# Patient Record
Sex: Male | Born: 1935 | Race: White | Hispanic: No | Marital: Married | Smoking: Former smoker
Health system: Southern US, Community
[De-identification: ages and names within clinical notes are randomized; demographics above are authoritative.]

## PROBLEM LIST (undated history)

## (undated) DIAGNOSIS — K219 Gastro-esophageal reflux disease without esophagitis: Secondary | ICD-10-CM

## (undated) DIAGNOSIS — N400 Enlarged prostate without lower urinary tract symptoms: Secondary | ICD-10-CM

## (undated) DIAGNOSIS — I2510 Atherosclerotic heart disease of native coronary artery without angina pectoris: Secondary | ICD-10-CM

## (undated) DIAGNOSIS — D696 Thrombocytopenia, unspecified: Secondary | ICD-10-CM

## (undated) DIAGNOSIS — F0390 Unspecified dementia without behavioral disturbance: Secondary | ICD-10-CM

## (undated) DIAGNOSIS — F0280 Dementia in other diseases classified elsewhere without behavioral disturbance: Secondary | ICD-10-CM

## (undated) DIAGNOSIS — G309 Alzheimer's disease, unspecified: Secondary | ICD-10-CM

## (undated) DIAGNOSIS — E785 Hyperlipidemia, unspecified: Secondary | ICD-10-CM

## (undated) DIAGNOSIS — Z8679 Personal history of other diseases of the circulatory system: Secondary | ICD-10-CM

---

## 1998-08-20 ENCOUNTER — Other Ambulatory Visit: Admission: RE | Admit: 1998-08-20 | Discharge: 1998-08-20 | Payer: Self-pay | Admitting: Urology

## 1998-09-30 ENCOUNTER — Observation Stay (HOSPITAL_COMMUNITY): Admission: AD | Admit: 1998-09-30 | Discharge: 1998-10-01 | Payer: Self-pay | Admitting: Cardiology

## 1999-01-20 ENCOUNTER — Encounter: Payer: Self-pay | Admitting: Cardiothoracic Surgery

## 1999-01-20 ENCOUNTER — Encounter (INDEPENDENT_AMBULATORY_CARE_PROVIDER_SITE_OTHER): Payer: Self-pay | Admitting: *Deleted

## 1999-01-20 ENCOUNTER — Inpatient Hospital Stay (HOSPITAL_COMMUNITY): Admission: RE | Admit: 1999-01-20 | Discharge: 1999-01-26 | Payer: Self-pay | Admitting: Cardiology

## 1999-01-20 HISTORY — PX: CORONARY ARTERY BYPASS GRAFT: SHX141

## 1999-01-21 ENCOUNTER — Encounter: Payer: Self-pay | Admitting: Cardiothoracic Surgery

## 1999-01-22 ENCOUNTER — Encounter: Payer: Self-pay | Admitting: Cardiothoracic Surgery

## 1999-02-23 ENCOUNTER — Encounter (HOSPITAL_COMMUNITY): Admission: RE | Admit: 1999-02-23 | Discharge: 1999-05-24 | Payer: Self-pay | Admitting: Cardiology

## 1999-05-25 ENCOUNTER — Encounter (HOSPITAL_COMMUNITY): Admission: RE | Admit: 1999-05-25 | Discharge: 1999-08-23 | Payer: Self-pay | Admitting: Cardiology

## 1999-08-12 ENCOUNTER — Encounter: Payer: Self-pay | Admitting: Internal Medicine

## 1999-08-12 ENCOUNTER — Encounter: Admission: RE | Admit: 1999-08-12 | Discharge: 1999-08-12 | Payer: Self-pay | Admitting: Internal Medicine

## 2000-01-04 ENCOUNTER — Emergency Department (HOSPITAL_COMMUNITY): Admission: EM | Admit: 2000-01-04 | Discharge: 2000-01-04 | Payer: Self-pay | Admitting: *Deleted

## 2000-05-16 ENCOUNTER — Ambulatory Visit (HOSPITAL_COMMUNITY): Admission: RE | Admit: 2000-05-16 | Discharge: 2000-05-16 | Payer: Self-pay | Admitting: Cardiology

## 2000-05-22 ENCOUNTER — Encounter: Payer: Self-pay | Admitting: *Deleted

## 2000-05-22 ENCOUNTER — Encounter: Admission: RE | Admit: 2000-05-22 | Discharge: 2000-05-22 | Payer: Self-pay | Admitting: *Deleted

## 2000-06-26 ENCOUNTER — Ambulatory Visit (HOSPITAL_COMMUNITY): Admission: RE | Admit: 2000-06-26 | Discharge: 2000-06-26 | Payer: Self-pay | Admitting: Cardiology

## 2000-09-27 ENCOUNTER — Ambulatory Visit (HOSPITAL_COMMUNITY): Admission: RE | Admit: 2000-09-27 | Discharge: 2000-09-27 | Payer: Self-pay | Admitting: Internal Medicine

## 2001-03-01 ENCOUNTER — Ambulatory Visit (HOSPITAL_COMMUNITY): Admission: RE | Admit: 2001-03-01 | Discharge: 2001-03-01 | Payer: Self-pay | Admitting: Specialist

## 2001-06-18 ENCOUNTER — Ambulatory Visit: Admission: RE | Admit: 2001-06-18 | Discharge: 2001-06-18 | Payer: Self-pay | Admitting: Internal Medicine

## 2001-07-13 ENCOUNTER — Encounter (INDEPENDENT_AMBULATORY_CARE_PROVIDER_SITE_OTHER): Payer: Self-pay | Admitting: Specialist

## 2001-07-13 ENCOUNTER — Ambulatory Visit (HOSPITAL_COMMUNITY): Admission: RE | Admit: 2001-07-13 | Discharge: 2001-07-13 | Payer: Self-pay | Admitting: *Deleted

## 2003-06-04 ENCOUNTER — Ambulatory Visit (HOSPITAL_COMMUNITY): Admission: RE | Admit: 2003-06-04 | Discharge: 2003-06-04 | Payer: Self-pay | Admitting: Sports Medicine

## 2004-04-29 ENCOUNTER — Encounter: Admission: RE | Admit: 2004-04-29 | Discharge: 2004-04-29 | Payer: Self-pay | Admitting: Orthopedic Surgery

## 2004-04-30 ENCOUNTER — Ambulatory Visit (HOSPITAL_BASED_OUTPATIENT_CLINIC_OR_DEPARTMENT_OTHER): Admission: RE | Admit: 2004-04-30 | Discharge: 2004-04-30 | Payer: Self-pay | Admitting: Orthopedic Surgery

## 2004-04-30 ENCOUNTER — Ambulatory Visit (HOSPITAL_COMMUNITY): Admission: RE | Admit: 2004-04-30 | Discharge: 2004-04-30 | Payer: Self-pay | Admitting: Orthopedic Surgery

## 2004-11-01 ENCOUNTER — Encounter: Admission: RE | Admit: 2004-11-01 | Discharge: 2004-11-01 | Payer: Self-pay | Admitting: Internal Medicine

## 2005-03-02 ENCOUNTER — Encounter: Admission: RE | Admit: 2005-03-02 | Discharge: 2005-03-02 | Payer: Self-pay | Admitting: Neurosurgery

## 2005-03-14 ENCOUNTER — Encounter: Admission: RE | Admit: 2005-03-14 | Discharge: 2005-03-14 | Payer: Self-pay | Admitting: Neurosurgery

## 2005-04-14 ENCOUNTER — Encounter: Admission: RE | Admit: 2005-04-14 | Discharge: 2005-05-09 | Payer: Self-pay | Admitting: Neurosurgery

## 2005-07-25 ENCOUNTER — Encounter: Admission: RE | Admit: 2005-07-25 | Discharge: 2005-07-25 | Payer: Self-pay | Admitting: Orthopedic Surgery

## 2005-07-26 ENCOUNTER — Ambulatory Visit (HOSPITAL_BASED_OUTPATIENT_CLINIC_OR_DEPARTMENT_OTHER): Admission: RE | Admit: 2005-07-26 | Discharge: 2005-07-26 | Payer: Self-pay | Admitting: Orthopedic Surgery

## 2005-12-19 ENCOUNTER — Encounter: Admission: RE | Admit: 2005-12-19 | Discharge: 2005-12-19 | Payer: Self-pay | Admitting: Neurosurgery

## 2005-12-21 ENCOUNTER — Ambulatory Visit (HOSPITAL_COMMUNITY): Admission: RE | Admit: 2005-12-21 | Discharge: 2005-12-21 | Payer: Self-pay | Admitting: Neurosurgery

## 2006-01-11 ENCOUNTER — Inpatient Hospital Stay (HOSPITAL_COMMUNITY): Admission: AD | Admit: 2006-01-11 | Discharge: 2006-01-15 | Payer: Self-pay | Admitting: Neurosurgery

## 2006-11-07 ENCOUNTER — Ambulatory Visit (HOSPITAL_COMMUNITY): Admission: RE | Admit: 2006-11-07 | Discharge: 2006-11-08 | Payer: Self-pay

## 2006-11-07 ENCOUNTER — Encounter (INDEPENDENT_AMBULATORY_CARE_PROVIDER_SITE_OTHER): Payer: Self-pay | Admitting: Urology

## 2006-12-06 ENCOUNTER — Encounter: Admission: RE | Admit: 2006-12-06 | Discharge: 2006-12-06 | Payer: Self-pay | Admitting: Internal Medicine

## 2008-07-30 ENCOUNTER — Inpatient Hospital Stay (HOSPITAL_COMMUNITY): Admission: EM | Admit: 2008-07-30 | Discharge: 2008-08-08 | Payer: Self-pay | Admitting: Emergency Medicine

## 2008-07-30 ENCOUNTER — Ambulatory Visit: Payer: Self-pay | Admitting: Cardiothoracic Surgery

## 2008-08-01 ENCOUNTER — Encounter: Payer: Self-pay | Admitting: Cardiothoracic Surgery

## 2008-09-25 ENCOUNTER — Encounter (HOSPITAL_COMMUNITY): Admission: RE | Admit: 2008-09-25 | Discharge: 2008-12-24 | Payer: Self-pay | Admitting: Cardiology

## 2008-12-25 ENCOUNTER — Encounter (HOSPITAL_COMMUNITY): Admission: RE | Admit: 2008-12-25 | Discharge: 2009-03-03 | Payer: Self-pay | Admitting: Cardiology

## 2009-03-13 ENCOUNTER — Encounter: Admission: RE | Admit: 2009-03-13 | Discharge: 2009-03-13 | Payer: Self-pay | Admitting: Internal Medicine

## 2009-06-18 ENCOUNTER — Inpatient Hospital Stay (HOSPITAL_COMMUNITY): Admission: EM | Admit: 2009-06-18 | Discharge: 2009-07-02 | Payer: Self-pay | Admitting: Emergency Medicine

## 2009-06-19 ENCOUNTER — Encounter (INDEPENDENT_AMBULATORY_CARE_PROVIDER_SITE_OTHER): Payer: Self-pay | Admitting: Internal Medicine

## 2009-08-27 ENCOUNTER — Encounter (HOSPITAL_COMMUNITY): Admission: RE | Admit: 2009-08-27 | Discharge: 2009-11-25 | Payer: Self-pay | Admitting: Cardiology

## 2009-11-26 ENCOUNTER — Encounter (HOSPITAL_COMMUNITY): Admission: RE | Admit: 2009-11-26 | Discharge: 2009-12-04 | Payer: Self-pay | Admitting: Cardiology

## 2010-05-25 LAB — CARDIAC PANEL(CRET KIN+CKTOT+MB+TROPI)
CK, MB: 36.1 ng/mL (ref 0.3–4.0)
CK, MB: 4.4 ng/mL — ABNORMAL HIGH (ref 0.3–4.0)
CK, MB: 9.4 ng/mL (ref 0.3–4.0)
Relative Index: 1.1 (ref 0.0–2.5)
Relative Index: INVALID (ref 0.0–2.5)
Total CK: 144 U/L (ref 7–232)
Total CK: 1690 U/L — ABNORMAL HIGH (ref 7–232)
Total CK: 825 U/L — ABNORMAL HIGH (ref 7–232)
Total CK: 92 U/L (ref 7–232)
Troponin I: 1.98 ng/mL (ref 0.00–0.06)
Troponin I: 3.68 ng/mL (ref 0.00–0.06)
Troponin I: 4.05 ng/mL (ref 0.00–0.06)

## 2010-05-25 LAB — BASIC METABOLIC PANEL
BUN: 11 mg/dL (ref 6–23)
BUN: 11 mg/dL (ref 6–23)
BUN: 11 mg/dL (ref 6–23)
BUN: 11 mg/dL (ref 6–23)
BUN: 12 mg/dL (ref 6–23)
BUN: 15 mg/dL (ref 6–23)
BUN: 15 mg/dL (ref 6–23)
BUN: 15 mg/dL (ref 6–23)
BUN: 15 mg/dL (ref 6–23)
CO2: 18 mEq/L — ABNORMAL LOW (ref 19–32)
CO2: 24 mEq/L (ref 19–32)
CO2: 25 mEq/L (ref 19–32)
Calcium: 8.1 mg/dL — ABNORMAL LOW (ref 8.4–10.5)
Calcium: 8.3 mg/dL — ABNORMAL LOW (ref 8.4–10.5)
Calcium: 8.3 mg/dL — ABNORMAL LOW (ref 8.4–10.5)
Calcium: 8.5 mg/dL (ref 8.4–10.5)
Chloride: 105 mEq/L (ref 96–112)
Chloride: 106 mEq/L (ref 96–112)
Chloride: 109 mEq/L (ref 96–112)
Chloride: 114 mEq/L — ABNORMAL HIGH (ref 96–112)
Chloride: 114 mEq/L — ABNORMAL HIGH (ref 96–112)
Creatinine, Ser: 1.02 mg/dL (ref 0.4–1.5)
Creatinine, Ser: 1.07 mg/dL (ref 0.4–1.5)
Creatinine, Ser: 1.16 mg/dL (ref 0.4–1.5)
Creatinine, Ser: 1.19 mg/dL (ref 0.4–1.5)
Creatinine, Ser: 1.27 mg/dL (ref 0.4–1.5)
Creatinine, Ser: 1.31 mg/dL (ref 0.4–1.5)
Creatinine, Ser: 1.35 mg/dL (ref 0.4–1.5)
GFR calc Af Amer: >60 mL/min (ref >60)
GFR calc Af Amer: >60 mL/min (ref >60)
GFR calc Af Amer: >60 mL/min (ref >60)
GFR calc Af Amer: >60 mL/min (ref >60)
GFR calc non Af Amer: 51 mL/min — ABNORMAL LOW (ref >60)
GFR calc non Af Amer: 53 mL/min — ABNORMAL LOW (ref >60)
GFR calc non Af Amer: 59 mL/min — ABNORMAL LOW (ref >60)
GFR calc non Af Amer: >60 mL/min (ref >60)
GFR calc non Af Amer: >60 mL/min (ref >60)
Glucose, Bld: 124 mg/dL — ABNORMAL HIGH (ref 70–99)
Glucose, Bld: 134 mg/dL — ABNORMAL HIGH (ref 70–99)
Glucose, Bld: 89 mg/dL (ref 70–99)
Glucose, Bld: 92 mg/dL (ref 70–99)
Glucose, Bld: 94 mg/dL (ref 70–99)
Potassium: 3.5 mEq/L (ref 3.5–5.1)
Potassium: 3.7 mEq/L (ref 3.5–5.1)
Potassium: 3.8 mEq/L (ref 3.5–5.1)
Potassium: 3.9 mEq/L (ref 3.5–5.1)
Sodium: 140 mEq/L (ref 135–145)
Sodium: 140 mEq/L (ref 135–145)
Sodium: 141 mEq/L (ref 135–145)

## 2010-05-25 LAB — TYPE AND SCREEN
ABO/RH(D): B POS
Antibody Screen: NEGATIVE

## 2010-05-25 LAB — GLUCOSE, CAPILLARY
Glucose-Capillary: 101 mg/dL — ABNORMAL HIGH (ref 70–99)
Glucose-Capillary: 101 mg/dL — ABNORMAL HIGH (ref 70–99)
Glucose-Capillary: 102 mg/dL — ABNORMAL HIGH (ref 70–99)
Glucose-Capillary: 103 mg/dL — ABNORMAL HIGH (ref 70–99)
Glucose-Capillary: 104 mg/dL — ABNORMAL HIGH (ref 70–99)
Glucose-Capillary: 105 mg/dL — ABNORMAL HIGH (ref 70–99)
Glucose-Capillary: 109 mg/dL — ABNORMAL HIGH (ref 70–99)
Glucose-Capillary: 110 mg/dL — ABNORMAL HIGH (ref 70–99)
Glucose-Capillary: 111 mg/dL — ABNORMAL HIGH (ref 70–99)
Glucose-Capillary: 111 mg/dL — ABNORMAL HIGH (ref 70–99)
Glucose-Capillary: 115 mg/dL — ABNORMAL HIGH (ref 70–99)
Glucose-Capillary: 117 mg/dL — ABNORMAL HIGH (ref 70–99)
Glucose-Capillary: 117 mg/dL — ABNORMAL HIGH (ref 70–99)
Glucose-Capillary: 118 mg/dL — ABNORMAL HIGH (ref 70–99)
Glucose-Capillary: 120 mg/dL — ABNORMAL HIGH (ref 70–99)
Glucose-Capillary: 120 mg/dL — ABNORMAL HIGH (ref 70–99)
Glucose-Capillary: 121 mg/dL — ABNORMAL HIGH (ref 70–99)
Glucose-Capillary: 121 mg/dL — ABNORMAL HIGH (ref 70–99)
Glucose-Capillary: 121 mg/dL — ABNORMAL HIGH (ref 70–99)
Glucose-Capillary: 123 mg/dL — ABNORMAL HIGH (ref 70–99)
Glucose-Capillary: 124 mg/dL — ABNORMAL HIGH (ref 70–99)
Glucose-Capillary: 126 mg/dL — ABNORMAL HIGH (ref 70–99)
Glucose-Capillary: 126 mg/dL — ABNORMAL HIGH (ref 70–99)
Glucose-Capillary: 126 mg/dL — ABNORMAL HIGH (ref 70–99)
Glucose-Capillary: 127 mg/dL — ABNORMAL HIGH (ref 70–99)
Glucose-Capillary: 127 mg/dL — ABNORMAL HIGH (ref 70–99)
Glucose-Capillary: 129 mg/dL — ABNORMAL HIGH (ref 70–99)
Glucose-Capillary: 130 mg/dL — ABNORMAL HIGH (ref 70–99)
Glucose-Capillary: 132 mg/dL — ABNORMAL HIGH (ref 70–99)
Glucose-Capillary: 133 mg/dL — ABNORMAL HIGH (ref 70–99)
Glucose-Capillary: 135 mg/dL — ABNORMAL HIGH (ref 70–99)
Glucose-Capillary: 138 mg/dL — ABNORMAL HIGH (ref 70–99)
Glucose-Capillary: 139 mg/dL — ABNORMAL HIGH (ref 70–99)
Glucose-Capillary: 140 mg/dL — ABNORMAL HIGH (ref 70–99)
Glucose-Capillary: 142 mg/dL — ABNORMAL HIGH (ref 70–99)
Glucose-Capillary: 144 mg/dL — ABNORMAL HIGH (ref 70–99)
Glucose-Capillary: 145 mg/dL — ABNORMAL HIGH (ref 70–99)
Glucose-Capillary: 148 mg/dL — ABNORMAL HIGH (ref 70–99)
Glucose-Capillary: 151 mg/dL — ABNORMAL HIGH (ref 70–99)
Glucose-Capillary: 151 mg/dL — ABNORMAL HIGH (ref 70–99)
Glucose-Capillary: 155 mg/dL — ABNORMAL HIGH (ref 70–99)
Glucose-Capillary: 160 mg/dL — ABNORMAL HIGH (ref 70–99)
Glucose-Capillary: 182 mg/dL — ABNORMAL HIGH (ref 70–99)
Glucose-Capillary: 73 mg/dL (ref 70–99)
Glucose-Capillary: 79 mg/dL (ref 70–99)
Glucose-Capillary: 79 mg/dL (ref 70–99)
Glucose-Capillary: 94 mg/dL (ref 70–99)
Glucose-Capillary: 95 mg/dL (ref 70–99)
Glucose-Capillary: 96 mg/dL (ref 70–99)
Glucose-Capillary: 97 mg/dL (ref 70–99)
Glucose-Capillary: 98 mg/dL (ref 70–99)
Glucose-Capillary: 98 mg/dL (ref 70–99)
Glucose-Capillary: 99 mg/dL (ref 70–99)
Glucose-Capillary: 99 mg/dL (ref 70–99)

## 2010-05-25 LAB — CBC
HCT: 25 % — ABNORMAL LOW (ref 39.0–52.0)
HCT: 30 % — ABNORMAL LOW (ref 39.0–52.0)
HCT: 30.1 % — ABNORMAL LOW (ref 39.0–52.0)
HCT: 30.5 % — ABNORMAL LOW (ref 39.0–52.0)
HCT: 36.3 % — ABNORMAL LOW (ref 39.0–52.0)
HCT: 36.9 % — ABNORMAL LOW (ref 39.0–52.0)
HCT: 37.8 % — ABNORMAL LOW (ref 39.0–52.0)
Hemoglobin: 10.4 g/dL — ABNORMAL LOW (ref 13.0–17.0)
Hemoglobin: 12 g/dL — ABNORMAL LOW (ref 13.0–17.0)
Hemoglobin: 12.7 g/dL — ABNORMAL LOW (ref 13.0–17.0)
Hemoglobin: 12.7 g/dL — ABNORMAL LOW (ref 13.0–17.0)
Hemoglobin: 12.7 g/dL — ABNORMAL LOW (ref 13.0–17.0)
MCHC: 34.3 g/dL (ref 30.0–36.0)
MCHC: 34.4 g/dL (ref 30.0–36.0)
MCHC: 34.5 g/dL (ref 30.0–36.0)
MCHC: 34.7 g/dL (ref 30.0–36.0)
MCHC: 34.8 g/dL (ref 30.0–36.0)
MCV: 92.3 fL (ref 78.0–100.0)
MCV: 92.4 fL (ref 78.0–100.0)
MCV: 92.5 fL (ref 78.0–100.0)
MCV: 92.6 fL (ref 78.0–100.0)
MCV: 93 fL (ref 78.0–100.0)
MCV: 93.4 fL (ref 78.0–100.0)
MCV: 93.6 fL (ref 78.0–100.0)
MCV: 93.8 fL (ref 78.0–100.0)
MCV: 94.1 fL (ref 78.0–100.0)
MCV: 94.3 fL (ref 78.0–100.0)
Platelets: 100 10*3/uL — ABNORMAL LOW (ref 150–400)
Platelets: 114 10*3/uL — ABNORMAL LOW (ref 150–400)
Platelets: 141 10*3/uL — ABNORMAL LOW (ref 150–400)
Platelets: 60 10*3/uL — ABNORMAL LOW (ref 150–400)
Platelets: 60 10*3/uL — ABNORMAL LOW (ref 150–400)
Platelets: 61 10*3/uL — ABNORMAL LOW (ref 150–400)
Platelets: 61 10*3/uL — ABNORMAL LOW (ref 150–400)
Platelets: 61 10*3/uL — ABNORMAL LOW (ref 150–400)
Platelets: 64 10*3/uL — ABNORMAL LOW (ref 150–400)
Platelets: 79 10*3/uL — ABNORMAL LOW (ref 150–400)
RBC: 2.71 MIL/uL — ABNORMAL LOW (ref 4.22–5.81)
RBC: 2.98 MIL/uL — ABNORMAL LOW (ref 4.22–5.81)
RBC: 3.93 MIL/uL — ABNORMAL LOW (ref 4.22–5.81)
RDW: 14.4 % (ref 11.5–15.5)
RDW: 14.6 % (ref 11.5–15.5)
RDW: 14.9 % (ref 11.5–15.5)
RDW: 15 % (ref 11.5–15.5)
RDW: 15.1 % (ref 11.5–15.5)
RDW: 15.2 % (ref 11.5–15.5)
WBC: 5 10*3/uL (ref 4.0–10.5)
WBC: 5.4 10*3/uL (ref 4.0–10.5)
WBC: 5.5 10*3/uL (ref 4.0–10.5)
WBC: 6.3 10*3/uL (ref 4.0–10.5)
WBC: 6.8 10*3/uL (ref 4.0–10.5)

## 2010-05-25 LAB — HEPARIN LEVEL (UNFRACTIONATED)
Heparin Unfractionated: 0.13 IU/mL — ABNORMAL LOW (ref 0.30–0.70)
Heparin Unfractionated: 0.17 IU/mL — ABNORMAL LOW (ref 0.30–0.70)
Heparin Unfractionated: 0.33 IU/mL (ref 0.30–0.70)
Heparin Unfractionated: 0.43 IU/mL (ref 0.30–0.70)

## 2010-05-25 LAB — LIPID PANEL
LDL Cholesterol: 39 mg/dL (ref 0–99)
Total CHOL/HDL Ratio: 1.9 RATIO
Triglycerides: 27 mg/dL (ref <150)
VLDL: 5 mg/dL (ref 0–40)

## 2010-05-25 LAB — PROTIME-INR
INR: 1.24 (ref 0.00–1.49)
INR: 1.31 (ref 0.00–1.49)
INR: 1.36 (ref 0.00–1.49)
INR: 1.4 (ref 0.00–1.49)
INR: 1.41 (ref 0.00–1.49)
Prothrombin Time: 16.2 seconds — ABNORMAL HIGH (ref 11.6–15.2)
Prothrombin Time: 16.7 seconds — ABNORMAL HIGH (ref 11.6–15.2)
Prothrombin Time: 17 seconds — ABNORMAL HIGH (ref 11.6–15.2)
Prothrombin Time: 19.6 seconds — ABNORMAL HIGH (ref 11.6–15.2)
Prothrombin Time: 21.6 seconds — ABNORMAL HIGH (ref 11.6–15.2)

## 2010-05-25 LAB — BRAIN NATRIURETIC PEPTIDE
Pro B Natriuretic peptide (BNP): 1204 pg/mL — ABNORMAL HIGH (ref 0.0–100.0)
Pro B Natriuretic peptide (BNP): 674 pg/mL — ABNORMAL HIGH (ref 0.0–100.0)
Pro B Natriuretic peptide (BNP): 962 pg/mL — ABNORMAL HIGH (ref 0.0–100.0)

## 2010-05-25 LAB — PREPARE RBC (CROSSMATCH)

## 2010-05-25 LAB — HEMOCCULT GUIAC POC 1CARD (OFFICE): Fecal Occult Bld: NEGATIVE

## 2010-05-25 LAB — PLATELET COUNT: Platelets: 60 10*3/uL — ABNORMAL LOW (ref 150–400)

## 2010-05-26 LAB — CBC
HCT: 37.9 % — ABNORMAL LOW (ref 39.0–52.0)
Hemoglobin: 12.9 g/dL — ABNORMAL LOW (ref 13.0–17.0)
MCHC: 34 g/dL (ref 30.0–36.0)
MCV: 93.9 fL (ref 78.0–100.0)
Platelets: 75 K/uL — ABNORMAL LOW (ref 150–400)
RBC: 4.03 MIL/uL — ABNORMAL LOW (ref 4.22–5.81)
RDW: 15.1 % (ref 11.5–15.5)
WBC: 5.4 K/uL (ref 4.0–10.5)

## 2010-05-26 LAB — DIFFERENTIAL
Basophils Absolute: 0 K/uL (ref 0.0–0.1)
Basophils Relative: 0 % (ref 0–1)
Eosinophils Absolute: 0.2 K/uL (ref 0.0–0.7)
Eosinophils Relative: 4 % (ref 0–5)
Lymphocytes Relative: 19 % (ref 12–46)
Lymphs Abs: 1 K/uL (ref 0.7–4.0)
Monocytes Absolute: 0.3 10*3/uL (ref 0.1–1.0)
Monocytes Relative: 6 % (ref 3–12)
Neutro Abs: 3.8 K/uL (ref 1.7–7.7)
Neutrophils Relative %: 71 % (ref 43–77)

## 2010-05-26 LAB — CK TOTAL AND CKMB (NOT AT ARMC)
CK, MB: 12 ng/mL (ref 0.3–4.0)
Relative Index: 7.9 — ABNORMAL HIGH (ref 0.0–2.5)
Total CK: 152 U/L (ref 7–232)

## 2010-05-26 LAB — POCT CARDIAC MARKERS
CKMB, poc: 2.1 ng/mL (ref 1.0–8.0)
Myoglobin, poc: 116 ng/mL (ref 12–200)
Troponin i, poc: <0.05 ng/mL (ref 0.00–0.09)

## 2010-05-26 LAB — BASIC METABOLIC PANEL
CO2: 26 mEq/L (ref 19–32)
Chloride: 106 mEq/L (ref 96–112)
GFR calc non Af Amer: 55 mL/min — ABNORMAL LOW (ref >60)
Glucose, Bld: 124 mg/dL — ABNORMAL HIGH (ref 70–99)
Potassium: 4.4 mEq/L (ref 3.5–5.1)
Sodium: 138 mEq/L (ref 135–145)

## 2010-05-26 LAB — HEMOGLOBIN A1C
Hgb A1c MFr Bld: 5.9 % — ABNORMAL HIGH (ref <5.7)
Mean Plasma Glucose: 123 mg/dL — ABNORMAL HIGH (ref <117)

## 2010-05-26 LAB — D-DIMER, QUANTITATIVE: D-Dimer, Quant: 0.53 ug{FEU}/mL — ABNORMAL HIGH (ref 0.00–0.48)

## 2010-05-26 LAB — PROTIME-INR
INR: 1.17 (ref 0.00–1.49)
Prothrombin Time: 14.8 seconds (ref 11.6–15.2)

## 2010-05-26 LAB — MAGNESIUM: Magnesium: 2.3 mg/dL (ref 1.5–2.5)

## 2010-05-26 LAB — TROPONIN I: Troponin I: 1.32 ng/mL (ref 0.00–0.06)

## 2010-05-26 LAB — APTT: aPTT: 28 s (ref 24–37)

## 2010-05-26 LAB — BASIC METABOLIC PANEL WITH GFR
BUN: 18 mg/dL (ref 6–23)
Calcium: 8.6 mg/dL (ref 8.4–10.5)
Creatinine, Ser: 1.28 mg/dL (ref 0.4–1.5)
GFR calc Af Amer: >60 mL/min (ref >60)

## 2010-05-26 LAB — TSH: TSH: 1.61 u[IU]/mL (ref 0.350–4.500)

## 2010-05-26 LAB — GLUCOSE, CAPILLARY: Glucose-Capillary: 100 mg/dL — ABNORMAL HIGH (ref 70–99)

## 2010-06-14 LAB — BASIC METABOLIC PANEL
BUN: 17 mg/dL (ref 6–23)
BUN: 18 mg/dL (ref 6–23)
BUN: 19 mg/dL (ref 6–23)
Calcium: 9 mg/dL (ref 8.4–10.5)
Chloride: 111 mEq/L (ref 96–112)
Chloride: 113 mEq/L — ABNORMAL HIGH (ref 96–112)
Creatinine, Ser: 1.39 mg/dL (ref 0.4–1.5)
Creatinine, Ser: 1.48 mg/dL (ref 0.4–1.5)
Creatinine, Ser: 1.64 mg/dL — ABNORMAL HIGH (ref 0.4–1.5)
GFR calc non Af Amer: 41 mL/min — ABNORMAL LOW (ref >60)
GFR calc non Af Amer: 46 mL/min — ABNORMAL LOW (ref >60)
Glucose, Bld: 105 mg/dL — ABNORMAL HIGH (ref 70–99)
Glucose, Bld: 98 mg/dL (ref 70–99)

## 2010-06-14 LAB — CBC
HCT: 36 % — ABNORMAL LOW (ref 39.0–52.0)
Hemoglobin: 12.4 g/dL — ABNORMAL LOW (ref 13.0–17.0)
Hemoglobin: 12.4 g/dL — ABNORMAL LOW (ref 13.0–17.0)
MCHC: 34 g/dL (ref 30.0–36.0)
MCV: 91.4 fL (ref 78.0–100.0)
MCV: 92.8 fL (ref 78.0–100.0)
Platelets: 129 10*3/uL — ABNORMAL LOW (ref 150–400)
RBC: 3.95 MIL/uL — ABNORMAL LOW (ref 4.22–5.81)
RBC: 3.97 MIL/uL — ABNORMAL LOW (ref 4.22–5.81)
WBC: 6.5 10*3/uL (ref 4.0–10.5)

## 2010-06-14 LAB — DIFFERENTIAL
Basophils Relative: 1 % (ref 0–1)
Eosinophils Absolute: 0.4 10*3/uL (ref 0.0–0.7)
Eosinophils Relative: 5 % (ref 0–5)
Monocytes Absolute: 0.4 10*3/uL (ref 0.1–1.0)
Monocytes Relative: 6 % (ref 3–12)

## 2010-06-15 LAB — PROTIME-INR
INR: 1.1 (ref 0.00–1.49)
Prothrombin Time: 14.6 seconds (ref 11.6–15.2)
Prothrombin Time: 15.3 seconds — ABNORMAL HIGH (ref 11.6–15.2)

## 2010-06-15 LAB — CBC
HCT: 35.8 % — ABNORMAL LOW (ref 39.0–52.0)
HCT: 36.1 % — ABNORMAL LOW (ref 39.0–52.0)
HCT: 38.4 % — ABNORMAL LOW (ref 39.0–52.0)
Hemoglobin: 12.4 g/dL — ABNORMAL LOW (ref 13.0–17.0)
Hemoglobin: 12.4 g/dL — ABNORMAL LOW (ref 13.0–17.0)
Hemoglobin: 13.1 g/dL (ref 13.0–17.0)
MCHC: 33.9 g/dL (ref 30.0–36.0)
MCHC: 34.3 g/dL (ref 30.0–36.0)
MCV: 92 fL (ref 78.0–100.0)
MCV: 92.4 fL (ref 78.0–100.0)
MCV: 92.5 fL (ref 78.0–100.0)
MCV: 92.7 fL (ref 78.0–100.0)
Platelets: 105 10*3/uL — ABNORMAL LOW (ref 150–400)
Platelets: 107 10*3/uL — ABNORMAL LOW (ref 150–400)
Platelets: 109 10*3/uL — ABNORMAL LOW (ref 150–400)
Platelets: 117 10*3/uL — ABNORMAL LOW (ref 150–400)
RBC: 4.17 MIL/uL — ABNORMAL LOW (ref 4.22–5.81)
RBC: 4.2 MIL/uL — ABNORMAL LOW (ref 4.22–5.81)
RDW: 12.8 % (ref 11.5–15.5)
RDW: 12.8 % (ref 11.5–15.5)
WBC: 5.3 10*3/uL (ref 4.0–10.5)
WBC: 6.5 10*3/uL (ref 4.0–10.5)
WBC: 6.8 10*3/uL (ref 4.0–10.5)

## 2010-06-15 LAB — BASIC METABOLIC PANEL
BUN: 12 mg/dL (ref 6–23)
BUN: 17 mg/dL (ref 6–23)
CO2: 27 mEq/L (ref 19–32)
Calcium: 8.5 mg/dL (ref 8.4–10.5)
Chloride: 106 mEq/L (ref 96–112)
Chloride: 107 mEq/L (ref 96–112)
Chloride: 109 mEq/L (ref 96–112)
Creatinine, Ser: 1.27 mg/dL (ref 0.4–1.5)
Creatinine, Ser: 1.37 mg/dL (ref 0.4–1.5)
Creatinine, Ser: 1.41 mg/dL (ref 0.4–1.5)
GFR calc Af Amer: 59 mL/min — ABNORMAL LOW (ref >60)
GFR calc Af Amer: >60 mL/min (ref >60)
GFR calc non Af Amer: 46 mL/min — ABNORMAL LOW (ref >60)
GFR calc non Af Amer: 51 mL/min — ABNORMAL LOW (ref >60)
Glucose, Bld: 96 mg/dL (ref 70–99)
Potassium: 3.8 mEq/L (ref 3.5–5.1)
Potassium: 3.9 mEq/L (ref 3.5–5.1)
Sodium: 141 mEq/L (ref 135–145)
Sodium: 144 mEq/L (ref 135–145)

## 2010-06-15 LAB — TSH: TSH: 3.238 u[IU]/mL (ref 0.350–4.500)

## 2010-06-15 LAB — DIFFERENTIAL
Basophils Absolute: 0.1 10*3/uL (ref 0.0–0.1)
Basophils Relative: 1 % (ref 0–1)
Eosinophils Absolute: 0.3 10*3/uL (ref 0.0–0.7)
Eosinophils Relative: 5 % (ref 0–5)
Eosinophils Relative: 5 % (ref 0–5)
Lymphocytes Relative: 22 % (ref 12–46)
Lymphs Abs: 1.5 10*3/uL (ref 0.7–4.0)
Lymphs Abs: 1.6 10*3/uL (ref 0.7–4.0)
Monocytes Absolute: 0.5 10*3/uL (ref 0.1–1.0)
Neutro Abs: 4.4 10*3/uL (ref 1.7–7.7)

## 2010-06-15 LAB — CARDIAC PANEL(CRET KIN+CKTOT+MB+TROPI)
CK, MB: 4.8 ng/mL — ABNORMAL HIGH (ref 0.3–4.0)
Relative Index: INVALID (ref 0.0–2.5)
Total CK: 88 U/L (ref 7–232)
Troponin I: 0.25 ng/mL — ABNORMAL HIGH (ref 0.00–0.06)

## 2010-06-15 LAB — POCT CARDIAC MARKERS
CKMB, poc: 2.7 ng/mL (ref 1.0–8.0)
Myoglobin, poc: 105 ng/mL (ref 12–200)

## 2010-06-15 LAB — LIPID PANEL
Cholesterol: 154 mg/dL (ref 0–200)
LDL Cholesterol: 100 mg/dL — ABNORMAL HIGH (ref 0–99)
LDL Cholesterol: 109 mg/dL — ABNORMAL HIGH (ref 0–99)
Total CHOL/HDL Ratio: 5.9 RATIO
Triglycerides: 108 mg/dL (ref <150)
Triglycerides: 97 mg/dL (ref <150)
VLDL: 22 mg/dL (ref 0–40)

## 2010-06-15 LAB — HEPARIN LEVEL (UNFRACTIONATED): Heparin Unfractionated: 0.67 IU/mL (ref 0.30–0.70)

## 2010-07-20 NOTE — Cardiovascular Report (Signed)
[name], [name] NO.:  [id number]   MEDICAL RECORD NO.:  [id number]          PATIENT TYPE:  INP   LOCATION:  2901                         FACILITY:  [facility]   PHYSICIAN:  Sheliah Mends, MD      DATE OF BIRTH:  25-Oct-1929   DATE OF PROCEDURE:  08/01/2008  DATE OF DISCHARGE:                            CARDIAC CATHETERIZATION   PROCEDURE:  Selective coronary angiography and coronary artery bypass  graft angiography.   INDICATIONS:  Mr. On is a 77 year old gentleman with complex cardiac  history.  He underwent cardiac catheterization in 2000 and was found to  have single vessel disease.  At that time, a percutaneous coronary  intervention was attempted and resulted in a dissection of the left  anterior descending artery.  The patient underwent emergent coronary  artery bypass grafting at that time with LIMA to the distal LAD and  saphenous vein graft to the diagonal branch.  In addition, he underwent  aortic valve replacement with bioprosthetic valve due to history of  aortic stenosis.  The patient has done quite well over the years and was  followed by Dr. Aleen Campi.  He presented now to [facility] with  recurrent episodes of chest pain and was found to have mild elevation of  his troponin levels consistent with a non-ST myocardial infarction.  Despite aggressive medical therapy, the patient had recurrent episodes  of chest pain and subsequently, a decision was made to take him to the  cardiac catheterization lab for invasive workup.   PROCEDURE:  After informed consent was obtained, the patient was brought  to the second floor cardiac catheterization lab at [facility].  His right and left groin were draped in sterile fashion due to a history  of peripheral vascular disease.  Placement of a 5-French arterial sheath  into the right femoral artery was carried out using local anesthesia in  the modified Seldinger technique.  Subsequently,  selective coronary  angiography and graft angiography were performed using a 5-French right  and left #4 Judkins catheter.   FINDINGS:  Left main coronary artery:  The left main coronary artery is  a short vessel that bifurcates into the LAD and left circumflex artery.  The left main artery has a subtotal, greater than 95% occlusion in the  distal segment, extending into the proximal segment of the left  circumflex artery and the LAD.   LAD:  The left anterior descending artery is a medium-size vessel.  There is a 95% stenosis at the ostium and subsequent mild luminal  irregularities.  There is a stent visible in the mid segment of the LAD  right after the takeoff of the first diagonal or large septal branch.  There is evidence of in-stent restenosis with subtotal occlusion of the  stent.  In addition, there is a 60% lesion in the first diagonal branch.  The second diagonal branch appears to be patent with good flow through  the saphenous vein graft.  The distal segment of the LAD is visualized  during LIMA graft injection and appears to be patent.  There is  bidirectional flow with coaptation with the saphenous vein graft near  the diagonal 2.   Left circumflex artery:  The left circumflex artery has a 95% lesion at  the ostium.  The left circumflex artery is a large vessel that gives off  a large, branching obtuse marginal branch.  The obtuse marginal branch  has an 85% lesion in the mid segment.  There is a smaller obtuse  marginal 2 branch with a 40% lesion in the proximal segment.  The PDA  and PLA appears to be without significant disease.   Right coronary artery:  The right coronary artery is a very small vessel  with mild luminal irregularities, but no apparent hemodynamic  significant lesion.  SVG to diagonal branch patent graft with good  anastomosis, good proximal and distal anastomosis and then below that  the LIMA to the LAD is widely patent with bidirectional  competitive flow  into the LAD and good anastomosis at the proximal and distal site.   SUMMARY:  1. Severe native coronary artery disease with subtotal occlusion of      the left main coronary artery, subtotal occlusion of the left      circumflex artery at the ostium and in the proximal segment as well      as subtotal occlusion of the LAD in the mid segment and an 85%      lesion in the large obtuse marginal branch.  2. Patent saphenous vein graft to the diagonal artery and patent LIMA      to the LAD.   Dr. Clarene Duke and myself reviewed the findings of the angiogram and  discussed the results with the patient and his wife.  He has high grade  lesions in the left main and proximal left circumflex artery that are  unprotected.  A percutaneous coronary intervention to these vessels  would be high risk.  Given the locations of the lesions and the  severity, the patient has a poor prognosis, on medical therapy only.  After discussion with the family, we will ask our colleagues from  Cardiothoracic Surgery to evaluate Mr. Mahany and see whether he would be  a candidate for a redo coronary artery bypass grafting.  Should CT  surgery feel that he is not a candidate or alternatively the patient  does not wish to proceed with surgery, we would discuss the possibility  of percutaneous coronary intervention.  The patient's prognosis without  any type of intervention is poor and either surgical or percutaneous  coronary intervention and revascularization would be high-risk.  The  patient and his family understands these findings and agree to continued  hospitalization.      Sheliah Mends, MD  Electronically Signed     JE/MEDQ  D:  08/01/2008  T:  08/02/2008  Job:  [id number]

## 2010-07-20 NOTE — Discharge Summary (Signed)
[name], [name] NO.:  [id number]   MEDICAL RECORD NO.:  [id number]          PATIENT TYPE:  INP   LOCATION:  2901                         FACILITY:  [facility]   PHYSICIAN:  Hind I Eda Paschal, MD      DATE OF BIRTH:  1929-12-28   DATE OF ADMISSION:  07/30/2008  DATE OF DISCHARGE:                               DISCHARGE SUMMARY   PRIMARY CARE PHYSICIAN:  Massie Maroon, MD.   CARDIOLOGIST:  Antionette Char, MD   DISCHARGE DIAGNOSES:  1. Unstable angina.  2. Non-ST myocardial infarction.  3. Coronary artery disease status post cardiac cath on May 28, which      showed severe coronary artery disease with subtotal occlusion of      the left main coronary artery.  Subtotal occlusion of the left      circumflex artery as well as subtotal occlusion of left anterior      descending.  4. History of mild dementia.  5. History of benign prostatic hypertrophy.  6. Hyperlipidemia.  7. Chronic renal insufficiency, baseline creatinine 1.3.  8. History of gastroesophageal reflux disease.   PROCEDURE:  1. Cardiac cath.  Report did show severe coronary artery disease with      subtotal occlusion of left main coronary artery, subtotal occlusion      of LAD, and subtotal occlusion of circumflex artery.  2. Ultrasound of the kidney, no acute finding.  3. Chest x-ray status post bypass, probably chronic obstructive      pulmonary disease.   CONSULTATION:  Heart and Vascular consulted.   HISTORY OF PRESENT ILLNESS:  This is a 77 year old male with history of  coronary artery disease status post coronary artery bypass graft done in  2000.  He is admitted for chest pain and the patient admitted to  telemetry.  Cardiac enzyme did show elevated troponin.  The patient had  previous history.  Within the last week, he had 2 episodes of chest  pain, so Heart and Vascular consulted where they kindly did a  consultation and recommended cath.  Cath was done, report as above.  Thoracic  surgeon, Dr. Tyrone Sage did see the patient because of the  patient's history of dementia.   At this time, plan is not clear, either high-risk PCI versus coronary  artery bypass graft and further plan to be addressed by the  cardiologist, Dr. Tyrone Sage, also will see and evaluate the patient for  CABG.   Other problems remained stable during hospital stay.  Chronic renal  insufficiency remained stable.  The patient possibly has acute renal  insufficiency.  His creatinine is gradually improving.  Further plan as  per Cardiology.      Hind Bosie Helper, MD  Electronically Signed     HIE/MEDQ  D:  08/05/2008  T:  08/06/2008  Job:  [id number]

## 2010-07-20 NOTE — Op Note (Signed)
NAMERANDELL, TEARE                 ACCOUNT NO.:  [id number]   MEDICAL RECORD NO.:  [id number]          PATIENT TYPE:  AMB   LOCATION:  DAY                          FACILITY:  [facility]   PHYSICIAN:  Jamison Neighbor, M.D.  DATE OF BIRTH:  Oct 18, 1929   DATE OF PROCEDURE:  11/07/2006  DATE OF DISCHARGE:                               OPERATIVE REPORT   PREOPERATIVE DIAGNOSIS:  Benign prostatic hypertrophy with bladder  outlet obstruction.   POSTOPERATIVE DIAGNOSIS:  Benign prostatic hypertrophy with bladder  outlet obstruction.   PROCEDURES:  Cystoscopy and transurethral resection of the prostate.   SURGEON:  Jamison Neighbor, M.D.   ANESTHESIA:  General.   COMPLICATIONS:  None.   DRAINS:  A 24-French three-way Foley catheter for continuous bladder  irrigation.   SPECIMEN:  Chips of the prostate taken for pathologic evaluation.   HISTORY:  This 77 year old male has had problems with bladder outlet  obstruction with associated urgency, frequency and decrease in the force  of his urinary stream.  He has failed to respond to a combination of  alpha blockade and 5 alpha reductase inhibition.  He has elected to  undergo a TURP.  He understands the risks and benefits of the procedure  including postoperative bleeding and clot formation.  He gave full  informed consent.   PROCEDURE:  After successful induction of general anesthesia, the  patient was placed in the lithotomy position, prepped with Betadine and  draped in the usual sterile fashion.  Cystoscopy performed, urethra was  visualized and the patient was noted to have trilobar hypertrophy of the  prostate.  The urethra was then dilated up to 30-French with Sissy Hoff  sounds.  The Storz resectoscope was then inserted using a Timberlake  obturator, the Stern-McCarthy resectoscope was inserted with the 33 lens  in place.  Inspection showed the bladder neck was obstructed from  lateral lobe hypertrophy as well as some median lobe  hypertrophy.  The  bladder itself was moderately trabeculated but otherwise normal.  No  tumors or stones could be seen.  The ureteral orifices were normal in  configuration and location and were well back from the bladder neck.  Transurethral resection of the prostate was then performed.  The bladder  neck was taken down in the midline until flattened.  Dissection was  taken down to the surgical capsule down as far as the verumontanum.  The  right lobe was resected starting at the 11 o'clock position extending  down to the floor of the prostate.  The left lobe was resected in  identical fashion.  A small amount of tissue was taken from the apex.  Additional tissue resected from the floor until it was flat.  A rectal  examination showed there was little if any residual prostatic tissue.  All chips were irrigated from the bladder, ureters were not injured, the  bladder neck had not been undermined.  The verumontanum and sphincter  mechanism were normal and there was a wide open  prostatic fossa was little if any remaining tissue.  Adequate hemostasis  was obtained.  Resectoscope was removed.  The catheter was inserted  using a stylet guide.  The catheter was placed to irrigation, irrigated  normally.  The patient tolerated procedure well, was taken to recovery  in good condition.      Jamison Neighbor, M.D.  Electronically Signed     RJE/MEDQ  D:  11/07/2006  T:  11/07/2006  Job:  [id number]

## 2010-07-20 NOTE — H&P (Signed)
NAMEINGRAM, ONNEN NO.:  [id number]   MEDICAL RECORD NO.:  [id number]          PATIENT TYPE:  EMS   LOCATION:  MAJO                         FACILITY:  [facility]   PHYSICIAN:  Lonia Blood, M.D.DATE OF BIRTH:  12-17-1929   DATE OF ADMISSION:  07/30/2008  DATE OF DISCHARGE:                              HISTORY & PHYSICAL   PRIMARY CARE PHYSICIAN:  Dr. Pearson Grippe   CARDIOLOGIST:  Antionette Char, MD   CHIEF COMPLAINT:  Chest pain.   HISTORY OF PRESENT ILLNESS:  [name] is a very pleasant 77-  year-old gentleman with a known history of coronary artery disease being  status post coronary artery bypass graft x2 in November 2000.  He states  that he underwent a stress test probably 3-4 years ago.  He has not had  a more recent evaluation of his coronary arteries.  He did see Dr.  Aleen Campi in the office a number of days ago, but was asymptomatic at  that time.  Today, the patient reports he had the acute onset of right-  sided substernal chest type pressure while sitting in a chair watching  the [facility] [employer] play baseball.  He, in retrospect, reports that he  did have a similar episode approximately a week prior to today that was  self-limited.  Today's episode would not resolve.  He got up and walked  to his neighbor's house as his wife was away on business.  The neighbor  provided him with aspirin and called EMS.  EMS presented to the home.  At this time, the patient's pain had been constant for approximately 30  minutes or greater.  EMS administered total of three sublingual  nitroglycerin which essentially completely resolved the patient's pain.  He has had no recurrence of the pain since his third nitroglycerin.  At  the present time, he is resting comfortably in a hospital stretcher with  no complaints.  He denies shortness of breath associated with his  symptoms.  There was no diaphoresis.  He states that he has not had  dyspnea on exertion of  late.  There have been no recent changes in his  medications.   REVIEW OF SYSTEMS:  A comprehensive review of systems is unremarkable  with exception to the multiple positive elements noted in his present  illness above.   PAST MEDICAL HISTORY:  1. Dementia.  2. Coronary artery disease.      a.     Dissection of a coronary artery during cardiac cath November       2000.      b.     Emergent two vessel coronary bypass graft using saphenous       vein graft and LIMA November 2000.  3. Benign prostatic hypertrophy status post TURP.  4. Hypertension.  5. Hypercholesterolemia.  6. Lumbar stenosis status post posterior lateral arthrodesis per Dr.      Coletta Memos 2007.  7. Gastroesophageal reflux disease, esophageal spasms.  8. Status post tonsillectomy, adenoidectomy.  9. History of right renal artery stenosis status post stenting April  2002.  10.Aortic valve replacement using pericardial valve November 2000 at      time of CABG.   OUTPATIENT MEDICATIONS:  1. Aricept 10 mg daily.  2. Namenda 10 mg b.i.d.  3. Atenolol daily.  4. Folic acid daily.  5. Fish oil b.i.d.  6. Aspirin 81 mg daily.   ALLERGIES:  NO KNOWN DRUG ALLERGIES.   FAMILY HISTORY:  The patient's mother died of leukemia.  The patient's  father died of heart disease.  The patient's brother is apparently alive  but has prostate cancer.   SOCIAL HISTORY:  The patient is married.  He lives in [place] with  his wife.  He does not drink alcohol.  He has a remote history tobacco  abuse but does not presently smoke.  He is retired from both the [employer] the [employer] in [place].   LABORATORY DATA:  CBC is unremarkable.  INR is normal.  B-met is  unremarkable with exception of mildly elevated BUN to creatinine ratio  20 to 1.41.  Cardiac enzymes are negative times one point care markers.  Chest x-ray reveals no acute disease.  Currently EKG is sinus rhythm at  66 beats per minute with no acute  ST or T-wave change.   PHYSICAL EXAMINATION:  VITAL SIGNS:  Temperature 97.4, blood pressure  153/70, heart rate 65, respiratory 18, O2 saturation 99% on room air.  GENERAL:  Well-developed, well-nourished gentleman in no acute  respiratory distress.  HEENT:  Normocephalic, atraumatic.  Pupils equal round react light  accommodation.  Extraocular muscles intact bilaterally.  OC/OP clear.  NECK:  There is no JVD.  LUNGS:  Clear to auscultation bilaterally without wheeze, rhonchi.  CARDIOVASCULAR:  Regular rate and rhythm without murmur, gallop.  Normal  S1, S2.  ABDOMEN:  Thin, soft, bowel sounds present.  No organomegaly.  No  rebound or ascites.  Nontender, nondistended.  EXTREMITIES:  There is no significant cyanosis, clubbing, edema of  bilateral lower extremities.  NEUROLOGICAL:  Cranial II-XII intact bilaterally, 5/5 strength bilateral  lower extremities.  Intact sensation to touch throughout.  No Babinski.   IMPRESSION AND PLAN:  1. Chest pain.  [name] has a number of risk factors and indeed has a      known history of coronary disease.  Given his advanced age of 77,      we must assume that he does indeed have some degree of persistent      and even recurrent coronary disease at the present time.  The      question at this time is if his pain is true angina or has another      etiology.  Of note, the patient does have a history of reflux      disease with esophageal spasms.  It does not appear that he is on a      proton pump inhibitor or an H2 blocker at the present time.  We      will initiate Protonix during his hospital stay.  We will cycle      cardiac enzymes x3 and check serial EKGs.  The remaining question      is whether or not this patient would be a candidate for aggressive      intervention such as cardiac cath or PTCA with stenting.  The      patient is well-established with a cardiologist who knows him well.      After the patient's rule out is  complete, we will  consult Dr.      Aleen Campi to assist with the decision making in regard to an      appropriate risk stratification from this point forward.  2. Pericardial aortic valve replacement.  The patient has no symptoms      of aortic valve insufficiency or stenosis at the present time.      Clinically, there is no reason to suspect that his pericardial      valve is malfunctioning.  3. Hypercholesterolemia.  It appears that the patient is on fish oil      alone.  We will check a fasting lipid panel in the morning.  4. Hypertension.  The patient's blood pressure is mildly elevated at      the present time.  We will resume his atenolol      and follow this trend closely.  5. Dementia.  This indeed appears to be mild.  We will continue the      patient's Aricept, Namenda and follow him clinically.      Lonia Blood, M.D.  Electronically Signed     JTM/MEDQ  D:  07/30/2008  T:  07/31/2008  Job:  [id number]   cc:   Massie Maroon, MD  Antionette Char, MD

## 2010-07-20 NOTE — Discharge Summary (Signed)
NAMELIOR, HOEN NO.:  [id number]   MEDICAL RECORD NO.:  [id number]          PATIENT TYPE:  INP   LOCATION:  2038                         FACILITY:  [facility]   PHYSICIAN:  Herbie Saxon, MDDATE OF BIRTH:  Feb 15, 1930   DATE OF ADMISSION:  07/30/2008  DATE OF DISCHARGE:  08/08/2008                               DISCHARGE SUMMARY   ADDENDUM   Interim discharge summary previously dictated by Dr. Eda Paschal on August 05, 2008.   Discharge diagnosis remains the same.  Please add the right groin  hematoma improved.  Also add, she has a history of right renal artery  stenosis, status post stent.  Thrombocytopenia is stable.  Anemia is  also stable.  The patient's wife had extensive discussion with  cardiologist and for now, the decision is to manage him medically.  Considering the high risk of angioplasty, however, if he has recurrent  symptoms they may consider angioplasty at that time.  The patient has  been ambulating down the hall without any distress with assistance.  Again, cleared for discharge home with cardiac rehab per cardiology.   DISCHARGE CONDITION:  Stable.   DIET:  Heart-healthy, 2 g sodium, low cholesterol.   ACTIVITY:  Increase slowly with assistance of home health PT.  He would  also benefit from home health RN.   FOLLOWUP:  The patient is to follow up with his primary care physician,  Dr. Pearson Grippe in the next 5-7 days.  Follow up with Dr. Aleen Campi,  Cardiology in the next 1-2 weeks.   MEDICATIONS ON DISCHARGE:  1. Namenda 10 mg b.i.d.  2. Aricept 10 mg at bedtime.  3. Folic acid 1 mg daily.  4. Multivitamin tablet daily.  5. Enteric-coated aspirin 325 mg daily.  6. Vitamin D 400 units daily.  7. Niaspan 500 mg at bedtime.  8. Crestor 20 mg daily.  9. Imdur 120 mg daily.  10.Coreg 6.25 mg a.m. and Coreg 3.125 mg at bedtime.  11.Colace 100 mg b.i.d.  12.Percocet 5/325 mg 1 q.6 h. p.r.n.  13.Amlodipine 5 mg daily.   PHYSICAL  EXAMINATION:  GENERAL:  Today, he is an elderly man not in  acute respiratory distress.  VITAL SIGNS:  Temperature 97, pulse 81, respiratory rate is 18, blood  pressure 129/74, he is saturating 96% on room air.  He is presently demented, not in acute respiratory distress.  HEAD:  Atraumatic and normocephalic.  NECK:  Supple.  CHEST:  Clinically clear.  HEART:  Heart sounds 1, 2, and 3 with 2/6 systolic murmur.  There is an  old sternotomy scar in the chest.  ABDOMEN:  Soft and nontender.  Bowel sounds are present.  No  organomegaly.  No ventral hernia.  EXTREMITIES:  Peripheral pulses present.  NEUROLOGIC:  He is alert.  No acute neurologic deficits.   LABORATORY DATA:  Chemistry, sodium is 144, potassium 4.4, chloride 111,  bicarbonate 28, glucose 107, BUN 19, and creatinine 1.6.  Complete blood  count, WBC is 6.5, hematocrit 36, and platelet count is 129.      M[place]juola I  Christella Noa, MD  Electronically Signed     MIO/MEDQ  D:  08/08/2008  T:  08/08/2008  Job:  [id number]

## 2010-07-20 NOTE — Consult Note (Signed)
[name], [name] NO.:  [id number]   MEDICAL RECORD NO.:  [id number]           PATIENT TYPE:   LOCATION:                                 FACILITY:   PHYSICIAN:  Sheliah Plane, MD    DATE OF BIRTH:  1929/12/03   DATE OF CONSULTATION:  08/01/2008  DATE OF DISCHARGE:                                 CONSULTATION   REQUESTING PHYSICIAN:  Thereasa Solo. Little, MD   FOLLOWUP CARDIOLOGIST:  Aram Candela. Tysinger, MD   PRIMARY CARE PHYSICIAN:  Massie Maroon, MD   REASON FOR CONSULTATION:  Recurrent coronary occlusive disease.   HISTORY OF PRESENT ILLNESS:  The patient is a 77 year old male with  known coronary occlusive disease and previous aortic valve replacement  10-11 years ago.  The patient's recent history has been particularly  marked progressive dementia which according to his family has been  getting worse, but 2 days ago, he was admitted to the hospital with  episode of chest pain.  In retrospect, he has had some minor episodes of  pain over the past week before, but because of the prolonged episode on  the 26th, his neighbor called 911, and he was brought to the emergency  room, sublingual nitroglycerin relieved the pain.  He was to be  discharged home, but because of elevated troponins of 0.3 and 0.16 and  MBs of 4.8 and 4.3, he was seen by Cardiology and a cardiac  catheterization was done this afternoon by Dr. Clarene Duke.   It is possible to get any history from the patient.  He is really not  able to carry on a coherent conversation of any substance.  Most of his  history is from his wife.   PAST MEDICAL HISTORY:  Significant for,  1. Dementia.  As outlined by his wife, 3-4 weeks ago, they went on a      cruise to [place], but she noted that she had significantly more      difficult time with him not because of chest pain, but because of      significant confusion.  2. History of coronary artery disease and aortic stenosis.  In      November 2000, he was  undergoing an angioplasty of the LAD with      dissection of the vessel and at that time, I did an emergency      coronary artery bypass grafting x2 and aortic valve replacement      with a vein graft to the diagonal in the left internal mammary to      the LAD.  3. Benign prostatic hypertrophy.  4. Hypertension.  5. Hypercholesterolemia.  6. Lumbar stenosis status post back surgery by Dr. Franky Macho, in 2007.  7. Gastroesophageal reflux status post tonsillectomy, adenoidectomy,      history of right renal artery stenosis, stent placed in April 2002.   FAMILY AND SOCIAL HISTORY:  The patient's mother died of leukemia.  Father died of heart disease.  One brother has prostate cancer.  The  patient is married, lives with his wife who does  a significant amount of  care for him.  He does not smoke, retired from [employer], and who is  in record.   ALLERGIES:  He has no known allergies.   CURRENT MEDICATIONS:  Aricept, Namenda, atenolol, folic acid, fish oil,  and aspirin.   REVIEW OF SYSTEMS:  Obtained both from the chart and also from the  patient's family.  The patient himself is really not able to give much  in the detail of past history.   PHYSICAL EXAMINATION:  VITAL SIGNS:  Blood pressure is 150/60, heart  rate 60, and his O2 sats 99% on 2 L.  GENERAL:  The patient appears older than his stated age.  At the time of  exam, he is totally confused.  He is able to answer some simple  questions, but his speech is somewhat garbled, but at times, he was able  to ask reasonable question, but does not appear to understand any  context of his current situation nor why he is here.  He actually  thought he was in a department store.  HEENT:  Pupils are equal, round, reactive to light.  NECK:  He has no jugular venous distention.  LUNGS:  Clear.  CARDIAC:  Regular rate and rhythm.  I do not appreciate any murmur of  aortic insufficiency and the valve sounds are crisp.  EXTREMITIES:  He  has a dressing on the right groin from his calf, has  palpable DP and PT pulses bilaterally.   LABORATORY FINDINGS:  Revealed a creatinine of 1.4.  Cardiac  catheterization films are reviewed.  He has a small nondominant right  patent vein graft to the diagonal and left internal mammary to the  distal LAD with competitive flow between the two.  There was a stent in  the proximal LAD with vessels occluded and several small diagonal  branches just prior to this.  He has a 95% stenosis in the proximal  circumflex coronary artery, which has several obtuse marginal branches  and supplies posterior descending.   IMPRESSION:  After seeing the patient following cardiac catheterization  with his wife and multiple family members, the patient has a very  difficult situation as he has significant progressive dementia in his  conversation, and although his chart is labeled mild dementia.  It  appears with that it is much more than mild and has a very difficult  anatomic situation.  Anatomically, the redo coronary artery bypass  grafting with grafting to the obtuse marginal and posterior descending  would be a reasonable approach; however, the patient's significant  dementia would make it difficult for him to assist in his postoperative  care and likely end up requiring significant nursing home or home care  following surgery.  On the other hand, angioplasty would probably cause  less disruption in his overall lifestyle, but the anatomic situation  would make it a high-risk angioplasty of the proximal circumflex  extended into the left main with somewhat protected anterior wall with  the left internal mammary and vein graft to the diagonal.  At this  point, I had more than a 45-minute discussion with the patient and  family about the risks and options of surgery in the current situation.  With his current mental status, I would definitely not operate on him as  emergency.  If some of his current  mental disruption clears, for several  days we could consider redo surgery, but also should be strongly  considering high risk angioplasty and  discussed the options with the  patient's family before making a final decision.      Sheliah Plane, MD  Electronically Signed     EG/MEDQ  D:  08/01/2008  T:  08/02/2008  Job:  [id number]   cc:   Massie Maroon, MD  Thereasa Solo Little, M.D.  Antionette Char, MD

## 2010-07-20 NOTE — H&P (Signed)
NAMEDRAPER, GALLON                 ACCOUNT NO.:  [id number]   MEDICAL RECORD NO.:  [id number]          PATIENT TYPE:  AMB   LOCATION:  DAY                          FACILITY:  [facility]   PHYSICIAN:  Jamison Neighbor, M.D.  DATE OF BIRTH:  03-Apr-1929   DATE OF ADMISSION:  11/07/2006  DATE OF DISCHARGE:                              HISTORY & PHYSICAL   MAIN DIAGNOSIS:  Benign prostatic hypertrophy with bladder outlet  obstruction.   SECONDARY DIAGNOSES:  1. History of coronary artery disease.  2. History of hypertension.  3. History of atherosclerotic peripheral vascular disease.  4. History of mild anemia.  5. History of early dementia.   HISTORY OF PRESENT ILLNESS:  This 77 year old male has had problems with  bladder obstruction for several years.  He originally responded to  medication.  He has been on both alpha blockade and 5-alpha reductase in  combination.  He is at a point where his frequency and urgency are very  poorly controlled, and he does not feel he is emptying his bladder well.  He has undergone evaluation which certainly confirmed the presence of  bladder outlet obstruction.  He is now wanting to undergo a TURP.  He  was given several options for therapy.  In fact we have even tried  anticholinergic therapy.  It was felt by his family physician that  anticholinergics should not be tried because the patient is on Aricept,  and he has had some problems with confusion.  The patient says he cannot  get to the bathroom and has had some problems with incontinence.  We  plan to go ahead and proceed with TURP.   The patient is to be admitted following his TURP.   PAST MEDICAL HISTORY:  Remarkable for heart disease.  He has also had a  stroke in the past.  He has elevated cholesterol.   PAST SURGICAL HISTORY:  Coronary artery bypass graft.  He has also had  stent placement.  He has had shoulder surgery and back surgery in the  past.   MEDICATIONS:  Atenolol, Flomax, folic  acid, Lovaza, multivitamins,  vitamin D, Zetia, Aricept, finasteride, aspirin which has been stopped,  and B12.   FAMILY HISTORY:  Noncontributory.  His mother died of leukemia.  His  father died of heart disease.  He also has a brother with prostate  cancer.  We do know, however, that the patient's PSA plummeted when he  was placed on Proscar and most recently was at 1.56.   SOCIAL HISTORY:  Unremarkable.  He does not use tobacco in 25 years.  He  does not drink much alcohol.   REVIEW OF SYSTEMS:  Occasional easy bruising, shortness of breath, back  pain, joint pain, constipation, and anxiety.   PHYSICAL EXAMINATION:  GENERAL:  A well-developed, well-nourished male  in no acute distress.  VITAL SIGNS:  Temperature 97.1, pulse 84, respirations 18, blood  pressure 105/67.  HEAD AND NECK:  Normocephalic, atraumatic.  Cranial nerves II-XII are  grossly intact.  Neck supple, no adenopathy or thyromegaly.  Oropharynx  was clear.  LUNGS:  Clear.  HEART:  Regular rate and rhythm without murmurs, thrills, gallops, rubs,  heaves.  ABDOMEN:  Soft, nontender with no palpable masses, rebound or guarding.  GU:  Shows normal testicles bilaterally.  Penis is uncircumcised, but,  otherwise, normal.  Meatus is unremarkable.  Penile shaft is normal.  RECTAL:  Shows 2-3+ smooth, flat non-nodular prostate that has decreased  somewhat in size since he was started on Proscar.  EXTREMITIES:  Have no cyanosis, clubbing or edema.  NEUROLOGICAL:  appears to be intact.  VASCULAR:  no ateral or venous insufficiency  rje   IMPRESSION:  1. Benign prostatic hypertrophy.  2. Bladder outlet obstruction.   PLAN:  Admit following TURP.      Jamison Neighbor, M.D.  Electronically Signed     RJE/MEDQ  D:  11/07/2006  T:  11/07/2006  Job:  [id number]

## 2010-07-23 NOTE — Discharge Summary (Signed)
NAMEWILLIS, HOLQUIN NO.:  [id number]   MEDICAL RECORD NO.:  [id number]          PATIENT TYPE:  INP   LOCATION:  0272                         FACILITY:  [facility]   PHYSICIAN:  Coletta Memos, M.D.     DATE OF BIRTH:  15-Jul-1929   DATE OF ADMISSION:  01/11/2006  DATE OF DISCHARGE:  01/15/2006                               DISCHARGE SUMMARY   ADMITTING DIAGNOSIS:  Lumbar stenosis, L4-5.   POSTOPERATIVE DIAGNOSIS:  Lumbar stenosis, L4-5.   PROCEDURE:  Posterior lumbar interbody fusion at L4-5 with 11-mm cages  x2, morselized allograft, SPIRE plate placement.   COMPLICATIONS:  None.   DISCHARGE STATUS:  Alive and well.   INDICATION:  Mr. Millette is a long-term patient of mine whom I have  followed for a while.  He had lumbar stenosis and clear-cut evidence of  neurogenic claudication.  He decided to undergo operative decompression.  Secondary to his spondylolisthesis which he had, I recommended that he  be fused.  He also had a synovial cyst at L4-5.   HOSPITAL COURSE:  He was admitted, taken to the operating room and had  an uncomplicated procedure.  Postoperatively, he did quite well.  He was  discharged home by Dr. Colon Branch.  He was tolerating a regular diet  upon discharge, moving his legs well also.           ______________________________  Coletta Memos, M.D.     KC/MEDQ  D:  02/10/2006  T:  02/11/2006  Job:  [id number]

## 2010-07-23 NOTE — Op Note (Signed)
[name], [name]                 ACCOUNT NO.:  [id number]   MEDICAL RECORD NO.:  [id number]          PATIENT TYPE:  AMB   LOCATION:  DSC                          FACILITY:  [facility]   PHYSICIAN:  Katy Fitch. 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   POSTOPERATIVE DIAGNOSIS:  Chronic stenosis tenosynovitis of right ring  finger A1 pulley with stiffness of proximal interphalangeal joint.   OPERATION:  Release of right ring finger A1 pulley.   OPERATING SURGEON:  Katy Fitch. Sypher, M.D.   ASSISTANT:  Jonni Sanger, P.A.   ANESTHESIA:  2% lidocaine and 0.25% Marcaine metacarpal head-level block,  right ring finger, supplemented by IV sedation, supervising anesthesiologist  is Dr. Jacklynn Bue.   INDICATIONS:  [name] is a 77 year old man referred by Dr. Higinio Plan  for evaluation and management of a locking right ring trigger finger.   He has a history of chronic stenosing tenosynovitis and background  osteoarthrosis.  [name] was cleared for surgery by his cardiologist and  primary care physician and subsequently brought to the operating room at  this time for release of his right ring finger A1 pulley.   Questions were invited and answered preoperatively.   PROCEDURE:  [name] is brought to the operating room and placed in  supine position on the table.  Following routine Betadine prep of the hand,  0.25% Marcaine and 2% lidocaine were injected into the flexor sheath in the  area surrounding the A1 pulley to obtain a digital block.  When anesthesia  was satisfactory, the arm was prepped with Betadine soap and solution and  sterilely draped.   Following exsanguination of the right arm with an Esmarch bandage, an  arterial  tourniquet on the proximal forearm was inflated to 240 mmHg.  The procedure commenced with a short transverse incision directly over the  thickened A1 pulley.  The subcutaneous tissues were carefully divided,  revealing the palmar fascia.  The fibers of the fascia were released,  revealing the A1 pulley.  The neurovascular bundles were gently retracted.  The pulley was then released with scalpel and scissors.  Full active range  of motion of the finger was recovered except for the PIP stiffness due to  osteoarthrosis, demonstrating a 15-degree flexion fracture.   [name] tolerated the procedure well.  His wound was irrigated and repaired  with A mattress suture of 5-0 nylon followed by application of a compressive  dressing with Xeroflo, sterile gauze and Ace wrap.   There were no apparent complications.   [name] will return for follow-up next week for dressing change and  advancement to a full exercise program.  We anticipate suture removal at  seven to 10 days.  [name]. [name], M.D.   NOTE:  For aftercare, [name] is given a prescription for Darvocet-N 100  one p.o. q. 4-6  hours p.r.n. pain, 20 tablets without refill.  He will  contact our office for a reevaluation sooner should he be noted to have any  problems with his medication or dressing.      RVS/MEDQ  D:  04/30/2004  T:  04/30/2004  Job:  [id number]

## 2010-07-23 NOTE — Op Note (Signed)
NAMECAP, MASSI NO.:  [id number]   MEDICAL RECORD NO.:  [id number]          PATIENT TYPE:  INP   LOCATION:  5034                         FACILITY:  [facility]   PHYSICIAN:  Coletta Memos, M.D.     DATE OF BIRTH:  Sep 27, 1929   DATE OF PROCEDURE:  01/11/2006  DATE OF DISCHARGE:                                 OPERATIVE REPORT   PREOPERATIVE DIAGNOSIS:  Lumbar stenosis L4-5, degenerative  spondylolisthesis L4-5, lumbar radiculopathy.   POSTOPERATIVE DIAGNOSIS:  Lumbar stenosis L4-5, degenerative  spondylolisthesis L4-5, lumbar radiculopathy.   PROCEDURE:  1. Posterior lumbar interbody arthrodesis, L4-5 with two 11 mm Peak      Synthes cages filled with morselized allograft.  2. Posterolateral arthrodesis L4-5.  3. Spire plate N8-2.   SURGEON:  Coletta Memos, M.D.   ASSISTANT:  Payton Doughty, M.D.   COMPLICATIONS:  None.   ANESTHESIA:  General endotracheal.   INDICATIONS:  [name] is a 77 year old who presented with neurogenic  claudication secondary to lumbar stenosis at L4-5.  He also had a  degenerative spondylolisthesis which did have movement on flexion and  extension films.  I therefore recommended and he agreed to undergo operative  decompression after a long trial of conservative treatment did not provide  lasting benefit.   OPERATIVE NOTE:  [name] was brought to the operating room.  He was  intubated and placed under a general anesthetic without difficulty.  A Foley  catheter was placed under sterile conditions.  He was rolled prone onto a  Jackson table.  All pressure points were properly padded.  His back was  prepped and he was draped in a sterile fashion.  I infiltrated 20 mL 1/2%  lidocaine with 1:200,000 strength epinephrine into the lumbar region.  I  opened the skin with a #10 blade and took this down to the thoracolumbar  fascia sharply.  I then exposed the lamina of L4 and L5 bilaterally.  I  confirmed this with intraoperative x-ray  showing a double ended ganglion  knife inferior to the lamina of L5.  The facettes were noted to be quite  arthropathic.  There is a good deal of extraneous facette material  bilaterally.  I then performed hemilaminectomies of L4 and of L5 using a  high-speed drill and Kerrison punch.  I left portion of the lamina intact  both the spinous processes.  I was able to then fully decompress the spinal  canal by removing all the ligamentum flavum and seeing that there is no  compression of the nerve roots on either side.  Once the decompression was  done I then proceeded with a diskectomy which was done bilaterally.  There  was a good deal of partially calcified annulus.  The disk material was  removed.  I used disk space shavers of 7 and 9 mm diameter in the disk  space.  I then prepared the endplates for arthrodesis.  I packed two 11 mm  PEEK cages.   I packed two 11 mm PEEK cages and placed them into the disk space  bilaterally.  I retracted thecal sac.  Prior to doing that I packed the disk  space with 5 mL of morselized allograft.  I then placed a Spire plate  spanning the spinous processes of L4 and L5.  I then decorticated the bone  laterally and placed some bone in the facette joints bilaterally, placing  morselized allograft in the facette joints bilaterally at L4-5.  X-ray  showed the plate and the interbody devices in good position.  At this time I  then closed the wound with Vicryl sutures reapproximating the thoracolumbar  fascia and subcutaneous tissue.  I added a 3-0 nylon to reapproximate the  skin.  Sterile dressing was applied.  The patient tolerated procedure well.           ______________________________  Coletta Memos, M.D.     KC/MEDQ  D:  01/11/2006  T:  01/12/2006  Job:  [id number]

## 2010-07-23 NOTE — Op Note (Signed)
NAMECOMMODORE, [name]                 ACCOUNT NO.:  [id number]   MEDICAL RECORD NO.:  [id number]          PATIENT TYPE:  AMB   LOCATION:  DSC                          FACILITY:  [facility]   PHYSICIAN:  Katy Fitch. Sypher, M.D. DATE OF BIRTH:  08-Feb-1930   DATE OF PROCEDURE:  07/26/2005  DATE OF DISCHARGE:                                 OPERATIVE REPORT   PREOPERATIVE DIAGNOSIS:  Chronic stenosing tenosynovitis left long finger.   POSTOPERATIVE DIAGNOSIS:  Chronic stenosing tenosynovitis left long finger.   OPERATIONS:  Release of left long finger A1 pulley.   SURGEON:  Katy Fitch. Sypher, M.D.   ASSISTANT:  Marveen Reeks. Dasnoit, P.A.-C.   ANESTHESIA:  2% lidocaine metacarpal head level block of left long finger  supplemented by IV sedation.   SUPERVISING ANESTHESIOLOGIST:  Quita Skye. Krista Blue, M.D.   INDICATIONS:  [name] is a 77 year old gentleman referred through the  courtesy of Dr. Higinio Plan for evaluation of chronic stenosing  tenosynovitis of the left long finger.  [name] had had a prior episode of  stenosing tenosynovitis of the right ring finger treated surgically in  February 2006. He returned with complaints of locking of his left long  finger.  He is having difficulty playing golf to this predicament.  He  requested that we proceed with release of his left long finger A1 pulley at  this time.  After informed consent and anesthesia consultation by Dr.  Krista Blue, he is brought to the operating room at this time anticipating local  anesthesia of the finger with sedation.   PROCEDURE:  [name] is brought to the operating room and placed in the  supine position on the operating table.  Following light sedation, the left  arm was prepped with Betadine soap and solution and sterilely draped.  2%  lidocaine was infiltrated into the path of the intended incision and into  the flexor sheath of left long finger.  When anesthesia was satisfactory,  the arm was exsanguinated with an  Esmarch bandage and the arterial  tourniquet inflated to 270 mmHg.  The procedure commenced with a short  oblique incision in the distal palmar crease.  The subcutaneous tissues were  carefully divided revealing thickened pretendinous fibers of the palmar  fascia.  These were resected to prevent postoperative Dupuytren's cord.  The  fascia proximal to the A1 pulley was released and the synovial fold  inspected.  This was quite fibrotic.  The A1 pulley was released followed by  delivery of the tendons.  The fibrotic fold of synovium was resected with  scissors.  Thereafter, [name] demonstrated full active range of motion the  finger without residual signs of triggering.  The wound was then repaired  with mattress suture of 5-0 nylon.  A compressive dressing was applied with  Xeroflow, sterile gauze, and Ace wrap.  There no apparent complications.   For aftercare, [name] is provided with a prescription for Vicodin 5 mg one  p.o. q.4-6h. p.r.n. pain, 20 tablets without refill.      Katy Fitch Sypher, M.D.  Electronically Signed  RVS/MEDQ  D:  07/26/2005  T:  07/26/2005  Job:  [id number]   cc:   Janae Bridgeman. Eloise Harman., M.D.  Fax: [phone]

## 2010-07-23 NOTE — Cardiovascular Report (Signed)
[place]. [facility]  Patient:    [name], [name]                        MRN: [id number] Proc. Date: 05/16/00 Adm. Date:  [id number] Disc. Date: [id number] Attending:  Silvestre Mesi CC:         Catheterization Lab, [facility]   Cardiac Catheterization  PROCEDURES: 1. Left heart catheterization. 2. Coronary cineangiography. 3. Vein graft cineangiography. 4. Left internal mammary arterial graft cineangiography. 5. Aortic root cineangiography. 6. Selective right renal artery cineangiography. 7. Perclose of the right femoral artery.  INDICATIONS FOR PROCEDURES:  This 77 year old male was readmitted for cardiac catheterization after a follow-up treadmill exercise tolerance test was positive showing moderate risk for myocardial ischemia.  He has a history of single vessel coronary artery disease, status post bypass graft surgery of his LAD and his diagonal branch.  He had a dominant circumflex which was normal and a small normal right coronary artery.  He also had severe aortic valve disease, requiring aortic valve replacement at the same time as his coronary artery bypass graft surgery.  He now returns for assessment of his coronary arteries and his coronary grafts because of the moderate risk for ischemia found on his treadmill exercise tolerance test.  He also has a history of hypertension and moderate right renal artery stenosis seen on his prior cardiac cath.  PROCEDURE:  After signing an informed consent, the patient was premedicated with 50 mg of Benadryl intravenously and brought to the cardiac catheterization lab.  His right groin was prepped and draped in a sterile fashion and anesthetized locally with 1% lidocaine.  A 6-French introducer sheath was inserted percutaneously into the right femoral artery.  Then, 6-French #4 Judkins coronary catheters were used to make injections into the native coronary arteries.  The right coronary  catheter was used to make injections into the diagonal vein graft and also into the left internal mammary graft to the LAD.  A 6-French pigtail catheter was used to measure pressures in the aorta and to make a midstream injection into the root of the aorta.  The right coronary catheter was used to make selective injections into the right renal artery.  The patient tolerated the procedure well and no complications were noted at the end of the procedure.  The catheter and sheath were removed form the right femoral artery and hemostasis was easily obtained with a Perclose closure system.  MEDICATIONS GIVEN:  None.  HEMODYNAMIC DATA:  Aortic pressure 153/82 with a mean of 107.  Right renal artery pressure 86/66 with a mean of 74.  CINE FINDINGS:  CORONARY CINEANGIOGRAPHY:  LEFT CORONARY ARTERY:  The ostium and left main appear normal.  LEFT ANTERIOR DESCENDING:  The LAD is totally occluded in its middle segment after the first septal branch.  This is site of prior stent placement and is now totally occluded within the stent.  Proximal to the stent, there is a segmental plaque from the stent to the takeoff of the LAD.  The mid and distal segments of the LAD fill by way of vein graft and internal mammary graft.  CIRCUMFLEX CORONARY ARTERY:  The circumflex is a large dominant vessel supplying the posterior descending and posterolateral circulation to the left ventricle.  There is a mild plaque in its middle segment, causing a 20-30% stenosis.  There is normal antegrade flow.  RIGHT CORONARY ARTERY:  The right coronary artery is a  small diminutive vessel which ends at the acute angle and appears normal.  VEIN GRAFT CINEANGIOGRAPHY:  DIAGONAL VEIN GRAFT:  The diagonal vein graft appears normal with normal distal anastomotic site into the large diagonal branch.  There is normal antegrade flow and retrograde flow into the LAD, which also supplies the majority of the LAD with  bidirectional flow created form the left internal mammary graft.  LEFT INTERNAL MAMMARY GRAFT:  The LIMA graft appears normal with normal flow into the distal LAD.  RIGHT RENAL ARTERY:  The right renal artery has an 80% focal concentric stenosis at its origin.  There is antegrade flow.  The left renal artery appears normal.  AORTIC ROOT CINEANGIOGRAPHY:  Mid stream injection into the root of the aorta shows a very competent aortic valve without evidence of aortic insufficiency. The aortic root appears normal.  FINAL DIAGNOSES: 1. Single vessel coronary artery disease with total occlusion of the proximal    left anterior descending. 2. Normal appearing vein graft to the diagonal branch. 3. Normal left internal mammary graft to the left anterior descending. 4. No change in the native vessels with single vessel disease. 5. Normal minor plaque in the dominant circumflex, 30%. 6. Normal small right coronary artery. 7. Competent prosthetic aortic valve without aortic insufficiency. 8. Severe right renal artery stenosis, 80% with a 67 mmHg gradient. 9. Successful Perclose of the right femoral arteyr.  DISPOSITION: 2. Will monitor on the short stay unit prior to discharge later today. 2. Will also arrange for follow-up renal artery duplex ultrasound in the    office. DD:  05/16/00 TD:  05/16/00 Job: [id number]

## 2010-07-23 NOTE — Cardiovascular Report (Signed)
[place]. [facility]  Patient:    [name], [name]                        MRN: [id number] Proc. Date: 06/26/00 Adm. Date:  [id number] Attending:  Silvestre Mesi CC:         Peripheral Vascular Angiography Lab   Cardiac Catheterization  PROCEDURES: 1. Catheter placement, right femoral artery and abdominal aorta. 2. Abdominal aortogram. 3. Selective renal angiography, bilateral. 4. Percutaneous transluminal angioplasty with primary stent placement in the    right renal artery. 5. Perclose of the right femoral artery.  INDICATIONS FOR PROCEDURES:  This 77 year old male was noted to have a critical stenosis in his right renal artery during cardiac catheterization and abdominal aortogram.  An ultrasound study done at the office revealed a critical stenosis with a flow velocity of 200 cm/sec.  He has moderately well-controlled hypertension.  DESCRIPTION OF PROCEDURE:  After signing an informed consent, the patient was premedicated with 50 mg of Benadryl intravenously and brought to the peripheral vascular angiography lab on the sixth floor at [facility]. His right groin was prepped and draped in the sterile fashion and anesthetized locally with 1% lidocaine.  A 5 French introducer sheath was inserted percutaneously in the right femoral artery.  A 5 French, short pigtail catheter was inserted through the right femoral artery sheath and advanced to the abdominal aorta.  Pressures were recorded and a mid stream injection was made in the abdominal aorta visualizing the abdominal aorta renal arteries and common iliac arteries.  A 5 [place], short right Judkins coronary catheter was then selected and advanced to the mid abdominal aorta.  Selective injections were made into both renal arteries.  There were two renal arteries found on the left side.  After noting a critical stenosis of 90% in the ostium of the right renal artery, we then exchanged the right  femoral artery sheath for a 7 French sheath, and selected a right coronary guide catheter which was engaged in the ostium of the right renal artery.  We then selected a Hi-Torque Floppy guide wire which was advanced through the guide catheter into the right renal artery.  We then selected a angioplasty system with a 6 x 1.5 Aviator balloon catheter along with a [id number] BAS stent and after proper preparation this was advanced over the guidewire and positioned within the right renal artery lesion.  Two inflations were made, the first at 12 atmospheres for 30 seconds and the second at 14 atmospheres for 30 seconds.  After this second deployed inflation, the balloon catheter was removed and injection again into the right renal artery showed an excellent angiographic result with 0% residual lesion. The catheter and sheath were then removed from the right femoral artery and hemostasis was easily obtained with a Perclose closure system.  MEDICATIONS GIVEN:  Heparin 3000 units IV.  HEMODYNAMIC DATA:  Abdominal aorta 164/86 with a mean of 121.  Right renal artery 123/80 with mean of 104.  CINE FINDINGS:  Abdominal aorta:  The abdominal aorta has diffuse atherosclerosis throughout the infrarenal abdominal aorta extending into both common iliac arteries.  The right common iliac artery had a mild to moderate stenosis.  Selective renal angiography:  Left renal artery.  There were two separate left renal arteries and both had mild 20% stenotic lesion at the origin.  Right renal artery:  There was a 90% focal concentric stenosis at the takeoff of  the right renal artery.  ANGIOPLASTY CINE:  Cine taken during the angioplasty procedure showed proper positioning of the guidewire and stent deployment system.  Final injection showed an excellent angiographic result and 0% residual lesion and normal antegrade flow.  There was no evidence of dissection or clot.  FINAL DIAGNOSES: 1. Critical stenosis,  right renal artery, 90%. 2. Mild stenosis in both left renal arteries, 20%. 3. Diffuse atherosclerosis, infrarenal abdominal aorta in both common    iliac arteries. 4. Successful angioplasty with primary stent placement in the right renal    artery lesion. 5. Successful Perclose of the right femoral artery.  DISPOSITION:  The patient will be monitored on the short-stay unit prior to discharge later today and will arrange for followup in the office in two weeks. DD:  06/26/00 TD:  06/26/00 Job: [id number]

## 2010-07-23 NOTE — Discharge Summary (Signed)
NAMEEMIL, WEIGOLD NO.:  [id number]   MEDICAL RECORD NO.:  [id number]          PATIENT TYPE:  INP   LOCATION:  9811                         FACILITY:  [facility]   PHYSICIAN:  Coletta Memos, M.D.     DATE OF BIRTH:  [birth year]   DATE OF ADMISSION:  01/11/2006  DATE OF DISCHARGE:  01/15/2006                               DISCHARGE SUMMARY   ADMITTING DIAGNOSIS:  L4-5 spondylolisthesis, lumbar stenosis,  spondylosis.   POSTOPERATIVE AND DISCHARGE DIAGNOSES:  L4-5 spondylolisthesis, lumbar  stenosis, spondylosis.   PROCEDURE:  PLIF 11-mm by 2 morselized allograft spinal plate  posterolateral arthrodesis L4-5.   COMPLICATIONS:  None.   DISCHARGE STATUS:  Alive and well.   MEDICATIONS:  Percocet and Flexeril.   COMPLICATIONS:  None.   Wound clean dry without signs of infection at discharge.   Mr. Blanke was admitted, had a decompressive laminectomy and subsequent  posterolateral arthrodesis at L4-5 secondary to neurogenic claudication  and lumbar stenosis.  Postoperatively, he did well.  He was able to void  voluntarily.  He was discharged home 3 days post-op doing well.           ______________________________  Coletta Memos, M.D.     KC/MEDQ  D:  02/24/2006  T:  02/24/2006  Job:  [id number]

## 2010-07-23 NOTE — Op Note (Signed)
[place]. [facility]  Patient:    [name]                           MRN: [id number] Proc. Date: 01/20/99 Adm. Date:  [id number] Attending:  Waldo Laine CC:         Aram Candela. Aleen Campi, M.D.                           Operative Report  PREOPERATIVE DIAGNOSIS:  Failed angioplasty with dissection of left anterior descending and diagonal, and moderate aortic stenosis, and moderate aortic insufficiency.  POSTOPERATIVE DIAGNOSIS:  Failed angioplasty with dissection of left anterior descending and diagonal, and moderate aortic stenosis, and moderate aortic insufficiency.  SURGICAL PROCEDURE.  Emergency coronary artery bypass grafting x 2 with the left internal mammary to the left anterior descending coronary artery, and reverse saphenous vein graft to the diagonal coronary artery, and replacement of aortic  valve with a #21 pericardial tissue valve.  SURGEON:  Gwenith Daily. Tyrone Sage, M.D.  FIRST ASSISTANT:  Lewis L. Ezzard Standing, P.A.  ANESTHESIA:  BRIEF HISTORY:  The patient is a 77 year old male with moderate emphysema, who previously had had an angioplasty done on the LAD in July.  At that time, the patient w as also noted to have moderate aortic stenosis with a significantly calcified aortic valve.  He initially had done well following his angioplasty, ut then continued to have angina, and because of continued symptoms and a positive  stress treadmill test, he was readmitted for cardiac catheterization.  This revealed a severe InStent stenosis and stenosis involving the takeoff at the bifurcation of the LAD and diagonal.  Attempt at repeat angioplasty on this resulted in dissection into the LAD and diagonal, and the surgical team was called as an emergency to evaluate the patient in the cath lab.  On evaluation, it became apparent from the patients records that in addition to  the failed angioplasty, that he also had at least moderate aortic  stenosis and y hand injection of the aortic root, 2+ aortic insufficiency.  Fluoroscopically, there was significant calcification.  A right heart catheterization was requested. Aortic valve area was calculated between 1.3 and 1.4.  It was felt that because of the patients significant calcification of the aortic valve, that it should be replaced at the time of this surgery, especially in light of the aortic insufficiency.  The procedure was discussed with the patients family and with the patient.  The risks and options were explained.  The patient was agreeable and signed informed consent.  DESCRIPTION OF PROCEDURE:  With Swan-Ganz and arterial line monitors in place, he patient underwent general endotracheal anesthesia.  A transesophageal echocardiography probe was placed and did confirm a three leaflet valve, but with significant calcification, and almost complete fusion of the left coronary and right noncoronary cuff, and significant calcium in the remaining cuff and leaflet. The patient was prepped with Betadine and draped in the usual sterile manner.  segment of vein was harvested from the left lower extremity.  A median sternotomy was performed.  The left internal mammary artery was dissected down as a pedicle graft.  The distal artery was divided and had good free flow.  The pericardium as opened.  The patient was systemically heparinized.  The ascending aorta and the  right atrium were cannulated.  A retrograde cardioplegia catheter was placed. he patient was placed on cardiopulmonary  bypass at 2.4 L per minute per sq m. Right superior pulmonary vein then was also placed.  The patients body temperature was cooled to 28 degrees.  The aortic cross clamp was applied.  Five hundred cubic centimeters of cold blood potassium cardioplegia was administered, initially antegrade and additional cardioplegia retrograde.  Attention was turned first to the diagonal coronary artery  which was opened and  admitted a 1.5 mm probe.  Using a running 7-0 Prolene, distal anastomosis was performed.  In a similar fashion, the LAD was opened and admitted a 1.5 mm probe distally.  Using a running 8-0 Prolene, the left internal mammary artery was anastomosed to the left anterior descending coronary artery.  Additional cold blood cardioplegia was administered retrograde.  The aortic root was then opened, and as noted on echocardiogram, there was complete fusion of the left and noncoronary cuff, and limitation of movement of the right coronary cuffs.  The cuffs were excised.  The annulus was freed of calcium and sized for a 21 pericardial valve.  Using #2 Tycron pledgeted sutures with the pledgets on the ventricular surface, the valve was seated well.  Care was taken to remove all calcific debris. Aortotomy was closed with a horizontal mattress 3-0 Prolene suture over a felt strips. Prior to closing the aorta, the head was put in a down position.  The heart was allowed to passively fill and deair.  A 16-gauge needle was introduced into the left ventricular apex to further deair the heart with removal of the Edwards bulldog on the mammary artery.  There was appropriate rise in myocardial septal temperature. The aortic cross clamp was removed with total cross clamp time of 95 minutes. he patient spontaneously converted to a sinus rhythm and a partial occlusion clamp was placed on the ascending aorta.  A single punch aortotomy was performed.  The vein graft to the diagonal coronary artery was anastomosed to the ascending aorta. ir was evacuated from the graft.  The partial occlusion clamp was removed.  The patients body temperature was rewarmed to 37 degrees.  The right superior pulmonary vein vent and cardioplegic catheter were removed.  With the patients body temperature rewarmed to 37 degrees, he was ventilated and weaned from cardiopulmonary bypass without  difficulty.  The transesophageal echocardiography probe confirmed good function of the aortic valve prosthesis, and also mitral valve.  The patient remained hemodynamically stable.  He was decannulated in the  usual fashion.  Protamine sulfate was administered.  Prior to surgery, the patient had been given Plavix, had been on long-term aspirin and was on Integrilin during his attempted failed angioplasty, aprotinin had been given during the case and platelets were administered following bypass.  With the operative field hemostatic, the pericardium was reapproximated.  Two atrial and two ventricular pacing wires had been applied.  The sternum was closed with #6 stainless steel wire.  The fascia closed with interrupted 0 Vicryl, running 3-0 Vicryl in the subcutaneous tissue, 4-0 subcuticular stitch in the skin edges, and dry dressings were applied.  The sponge and needle count were reported as correct at the completion of the procedure.  The patient tolerated the procedure well without obvious complication, and was transferred to the surgical intensive care unit for further postoperative care. DD:  01/21/99 TD:  01/22/99 Job: [id number]

## 2010-07-23 NOTE — Cardiovascular Report (Signed)
[place]  Patient:    [name]                           MRN: [id number] Proc. Date: 01/20/99 Adm. Date:  [id number] Attending:  Waldo Laine CC:         Gwenith Daily. Tyrone Sage, M.D.             Cardiac Catheterization Lab                        Cardiac Catheterization  PROCEDURE DONE BY:  Jonny Ruiz R. Aleen Campi, M.D.  PROCEDURES: 1. Left heart catheterization. 2. Coronary cineangiography. 3. Left internal mammary artery cineangiography. 4. Left ventricular cineangiogram. 5. Percutaneous transluminal coronary angioplasty of the mid left anterior    descending artery within the stent. 6. Right heart catheterization.  INDICATION FOR PROCEDURES:  This 77 year old male has a history of single-vessel coronary artery disease status post unstable angina in July 2000 and underwent cardiac catheterization with subsequent stenting of his mid LAD lesion on September 10, 1998.  He was also noted to have an aortic valve gradient at that time, and a right heart catheterization done conjunctively with that procedure showed moderate aortic valve disease with an aortic valve area of 1.5 cm sq.  He did well until recently when he again had the onset of exertional angina and a repeat treadmill exercise tolerance test done in our office yesterday showed an early double positive result and was then scheduled for repeat catheterization today.  PROCEDURE:  After signing an informed consent, the patient was premedicated with 1 mg of Versed and brought to the cardiac catheterization lab.  His right groin was prepped and draped in a sterile fashion and anesthetized locally with 1% lidocaine. A 6-French introducer sheath was inserted percutaneously into the right femoral  artery.  The 6-French #4 Judkins coronary catheters were used to make injections into the coronary arteries.  The right coronary catheter was used to make a mid  stream injection into the  left subclavian artery visualized in the left internal mammary artery.  A 6-French pigtail catheter was used to measure pressures in the left ventricle and aorta and to make mid stream injections into the left ventricle and root of the aorta.  After noting severe critical restenosis within the mid AD stent, we then discussed these findings with the patient and elected to proceed  with an angioplasty of this restenotic area within the stent.  We then selected a 6-French JL4 guide catheter which was advanced to the root of the aorta, and the tip was engaged in the ostium of the left coronary artery.  We then selected a short high torque floppy guidewire which was advanced through the guidewire into the LAD and with mild difficulty, it was passed through the critical stenotic area within the stent and positioned in the distal LAD.  The patient experienced chest pain with advancing the wire across the lesion.  We then selected a 3.0 x 15-mm  Solaris balloon catheter which was advanced over the guidewire and positioned within the stent.  Three inflations were made, the first at eight atmospheres for 22 seconds, the second at 16 atmospheres for 43 seconds, and the third at 20 atmospheres for 60 seconds.  There was marked difficulty in getting good expansion within the stent.  Following the final inflation, the patient experienced further pain and further injections into the  left coronary artery showed a marked dissection distal to the stent and extending into a large bifurcation involving the takeoff of the large diagonal branch.  The guidewire was withdrawn into the stented area and because there was very poor flow into the diagonal branch, the guidewire was advanced into the diagonal and the balloon was advanced into the diagonal several times without dilating the balloon.  This re-established antegrade flow  into the diagonal, and his chest pain was relieved.  We looked at  alternatives t that point and discussed these alternatives with Dr. Alanda Amass and with Dr. Tyrone Sage, and we felt that instead of doing further angioplasty or further stent placement in his LAD involving this large bifurcation, that as long as he had antegrade flow, we would recommend surgery with bypass grafting of his diagonal and LAD.  We then noted his gradient across the aortic valve and elected to proceed  with a right heart catheterization at that time to establish further data concerning his aortic stenosis.  The pigtail catheter was reinserted and advanced to the root of the aorta.  A mid stream injection was made into the root of the  aorta.  An 8-French introducer sheath was inserted percutaneously into the right femoral vein.  A 7-French Swan-Ganz catheter was inserted through the right femoral vein sheath and advanced to the right atrium, right ventricle, pulmonary artery and wedge positions.  Pressures were recorded, and cardiac output was measured using thermodilution technique.  Simultaneous oximetry samples and blood gas samples ere obtained from the pulmonary artery and left ventricle.  After noting a valve gradient of 6-10 mmHg and a valve area of approximately 1.5 to 1.3 cm sq, we then discussed the probability of needing the aortic root valve replaced during this  surgery.  The catheters were then removed, and both sheaths were sutured in place with 1-0 silk in the right groin.  The patient was then transferred to the surgical unit in preparation for coronary artery bypass graft surgery and aortic valve replacement.  The patient was experiencing mild chest pain prior to transfer, and his vital signs were stable, and his electrocardiogram was unchanged.  MEDICATIONS GIVEN:  Versed, a total of 3 mg IV; morphine, a total of 5 mg IV; heparin, 7000 units IV.  Integrilin was initially started and then discontinued  when consideration for surgery was  made.  HEMODYNAMIC DATA:  Right atrial pressure:  A wave 9, V wave 7, mean of 6. Right ventricular pressure:  30/0-8. Pulmonary artery pressure:  24/12, with a mean of 18. Pulmonary capillary wedge pressure:  A wave 15, V wave 12, mean of 11.  Left ventricular pressure:  150/0-11. Aortic pressure:  141/68, with a mean of 95. Cardiac output:  4.6 L/min. Cardiac index:  2.29. Aortic valve gradient:  12 mmHg. Aortic valve area:  1.5 cm sq.  CINE FINDINGS:  Coronary cineangiography: 1. Left coronary artery:  The ostium of the left main appears normal.  There is a    minor plaque in the distal left main with a 20% stenosis. 2. Left anterior descending artery:  The LAD is diffusely diseased.  The vessel is    fairly small in diameter throughout its length with a segmental narrowing    throughout the proximal segment and middle segment of 20-30.  The middle    segment is the site of the stent placement in July 2000.  There is a critical    restenosis within the distal portion of the stent  and extending just slightly    beyond the stent.  This is greater than 95% stenosis.  There is antegrade flow    into the distal LAD, and there is a large bifurcation just distal to the stent    with a large diagonal and distal LAD. 3. Circumflex coronary artery:  The circumflex is a large dominant vessel supplying    the posterolateral and posterior descending circulation.  There are minor    irregularities but no significant stenotic lesions. 4. Right coronary artery:  The right coronary artery is a small nondominant vessel    which appears normal. 5. Left internal mammary artery appears normal.  There is a lateral takeoff of he    LIMA which arises in the distal left subclavian.  An additional finding was n    ostial lesion in the left vertebral artery of 60-70%.  Left ventricular cineangiogram:  The left ventricular chamber size appears normal. There is very normal left ventricular  contractility with an ejection fraction of approximately 70%.  There were no abnormally moving segments.  The mitral valve  appears normal.  Aortic root cineangiography:  An injection into the root of the aorta shows a three-cusp valve with mild calcification and with decreased mobility of the leaflets.  There is 2+ aortic insufficiency.  Angioplasty cineangiograms:  Cineangiograms taken during the angioplasty procedure shows proper positioning of the guidewire in the distal LAD and proper positioning of the balloon catheter within the stent with a good balloon form obtained. Final injections in the left coronary artery post angioplasty shows a marked dissection distal to the stent with the dissection line extending into the bifurcation of he large diagonal and also, for a short distance, into the LAD at the bifurcation.  There was antegrade flow into both branches.  FINAL DIAGNOSES: 1. Single-vessel coronary artery disease. 2. Critical restenosis within the mid left anterior descending artery stent. 3. Normal left internal mammary artery. 4. Normal left ventricular function. 5. Successful angioplasty within the mid left anterior descending artery stent    but complicated by severe dissection distal to the stent. 6. Aortic valve disease with calcific aortic stenosis, moderate, and moderate    aortic insufficiency.  DISPOSITION:  The patient was transferred to the operating room for surgery by r. Ofilia Neas. DD:  01/20/99 TD:  01/20/99 Job: [id number]

## 2010-07-23 NOTE — Procedures (Signed)
[facility]  Patient:    [name], [name] Visit Number: [id number] MRN: [id number]          Service Type: END Location: ENDO Attending Physician:  Sabino Gasser Dictated by:   Sabino Gasser, M.D. Proc. Date: 07/13/01 Admit Date:  07/13/2001 Discharge Date: 07/13/2001                             Procedure Report  PROCEDURE:  Upper endoscopy.  INDICATIONS:  GERD.  ANESTHESIA:  Demerol 50 mg, Versed 5 mg.  DESCRIPTION OF PROCEDURE:  With the patient mildly sedated in the left lateral decubitus position, the Olympus videoscopic endoscope was inserted into the mouth and passed under direct vision through the esophagus, which appeared normal, into the stomach. Fundus, body, antrum, duodenal bulb, second portion of duodenum were visualized. From this point, the endoscope was slowly withdrawn taking circumferential views of the duodenal mucosa until the endoscope was then pulled back into the stomach, placed in retroflexion to view the stomach from below. The endoscope was then straightened and withdrawn taking circumferential views of the remaining gastric and esophageal mucosa stopping in the body of the stomach where erythema was seen, photographed, and biopsied. The patients vital signs and pulse oximeter remained stable. The patient tolerated the procedure well without apparent complications.  FINDINGS:  Erythema of body of stomach. Await biopsy report.  PLAN:  The patient will call Dr. Virginia Rochester for results and follow up with Dr. Virginia Rochester as an outpatient. Proceed to colonoscopy as planned. Dictated by:   Sabino Gasser, M.D. Attending Physician:  Sabino Gasser DD:  07/13/01 TD:  07/15/01 Job: [id number]

## 2010-07-23 NOTE — H&P (Signed)
[place]  Patient:    [name]                           MRN: [id number] Adm. Date:  [id number] Attending:  Waldo Laine Dictator:   Donzetta Matters, P.A. CC:         Janae Bridgeman. Eloise Harman., M.D.                         History and Physical  DATE OF BIRTH:  1930-02-09  PRIMARY CARE PHYSICIAN:  Janae Bridgeman. Eloise Harman., M.D.  CHIEF COMPLAINT:  Chest pain.  HISTORY OF PRESENT ILLNESS:  The patient is a 77 year old male that has known coronary artery disease, stent ______ in July this year in the LAD. Has noted some chest pain with exertion. He has had decreased activity because of his shortness of breath. There has been no diaphoresis. No nausea. He was seen in the office on November 14 for exercise treadmill test and developed some chest pain in stage  with EKG changes. Did have relief with one nitroglycerin spray and rest.  PAST MEDICAL HISTORY: 1. Does have known coronary artery disease as listed above. 2. Also has benign prostatic hypertrophy. 3. Allergic rhinitis. 4. Diverticulosis. 5. Gastroesophageal reflux disease. 6. Esophageal spasms. 7. Atypical chest pain. 8. History of heart murmur. 9. History of tobacco use.  PAST SURGICAL HISTORY: 1. Dental surgery. 2. Tonsillectomy. 3. Appendectomy. 4. Carpal tunnel surgery. 5. Rotator cuff repair.  ALLERGIES:  No known drug allergies.  CURRENT MEDICATIONS: 1. Cardura 4 mg daily. 2. Vitamin E 400 IU daily. 3. Multivitamin daily. 4. Saw palmetto two tablets b.i.d. 5. Prevacid 30 mg p.r.n. 6. Claritin 10 mg daily. 7. Proscar 5 mg daily. 8. Zocor 20 mg daily. 9. An 81-mg aspirin.  SOCIAL HISTORY:  He is married. Does drink an occasional alcoholic beverage. Does use some caffeine. No tobacco use. No drug use. He is presently retired.  FAMILY HISTORY:  Mother died of complications of leukemia. Father died age 5 of heart attack. One sister with breast  cancer.  REVIEW OF SYSTEMS:  He denies any fever, chills, or sweats. Has had the same weight for many years. Sleeps well. Denies any swelling to his feet and ankles. Denies any visual changes. Does wear glasses. He is hard of hearing. Has some ringing in his ears. States he has sneezing that he relates to seasonal allergies. Has partials upper and lower for dentures. They are out today. When he was seen in the office had some chest pain as noted in the HPI. Has an occasional cough that is productive with some wheezing. Again he is a nonsmoker. Denies any nausea, vomiting. Occasional heartburn. No diarrhea is reported. No constipation problem. Continues to have urinary problems with nocturia x 2 which he attributes to his prostate. He denies any leg pain but does have some generalized fatigue. Is able to walk easily on a treadmill. Does have a skin problem of rosacea. But otherwise no other rashes or problems there. Denies any problems of loss of consciousness. No dizziness. oes have some tingling to his fingers. He continues to have some stress in his life but denies any difficulty with appetite. No excessive thirst. No excessive hunger.  PHYSICAL EXAMINATION:  VITAL SIGNS:  He is afebrile, blood pressure is 148/84, weight is 185 pounds, respirations are 18 unlabored, pulse is 64.  GENERAL:  This is a 77 year old male at the present time well-developed, well-nourished appears in no acute distress. Does have prescription lenses.  HEENT:  Pupils are equal. Extraocular movements intact. Mouth and pharynx are benign. TMs are clear bilaterally. Mouth with his partials out today. Teeth appear in good repair.  NECK:  Supple with midline trachea. No adenopathy. No JVD. No bruits. No thyromegaly.  CHEST:  Clear to auscultation. No use of any accessory muscles for breathing.  BREASTS:  Normal adult male.  CARDIAC:  Regular rate and rhythm with 2/6 systolic murmur. Normal S1 and  S2. Normal PMI.  ABDOMEN:  Soft. Active bowel sounds. Nontender. No abdominal bruits are palpated.  EXTREMITIES:  Moves both upper and lower extremities without any difficulty. Steady gait.  SKIN:  Warm and dry.  There is no cyanosis, clubbing, pallor, or rashes.  NEUROLOGICAL:  He is oriented to person, place, time, and situation. Speech is clear. Cranial nerves appear grossly nonfocal.  LABORATORY DATA:  Chest x-ray done in the office does show a calcified aorta. Heart size is normal with a cardiothoracic ratio at 14/35. Lung fields are clear. T-spine with mild OA changes. The stent in his LAD is visible on chest x-ray.  CBC shows hemoglobin at 15, hematocrit 43.4, platelet count 138 which he has had steady low normal platelet count by office chart. PTT is 27.3, PT is 12.9, INR f 1.11. Basic metabolic panel does note the sodium at 152, potassium 5.4, chloride 117, CO2 is 25, calcium is normal, BUN is 18, creatinine 1. Hepatic panel is also normal. Lipid panel also is normal.  IMPRESSION: 1. Unstable angina. 2. Known coronary artery disease. 3. Hypercholesterolemia.  PLAN:  Elective heart catheterization with intervention as indicated. DD:  01/20/99 TD:  01/20/99 Job: [id number]

## 2010-07-23 NOTE — Procedures (Signed)
[facility]  Patient:    [name], [name] Visit Number: [id number] MRN: [id number]          Service Type: END Location: ENDO Attending Physician:  Sabino Gasser Dictated by:   Sabino Gasser, M.D. Proc. Date: 07/13/01 Admit Date:  07/13/2001 Discharge Date: 07/13/2001                             Procedure Report  PROCEDURE:  Colonoscopy.  INDICATIONS:  Rectal bleeding.  ANESTHESIA:  No additional given.  DESCRIPTION OF PROCEDURE:  With the patient mildly sedated in the left lateral decubitus position, a rectal exam was performed which was unremarkable. Prostate was slightly enlarged but soft. Subsequently, the Olympus videoscopic pediatric colonoscope was inserted in the rectum and passed under direct vision to the cecum identified by the ileocecal valve and appendiceal orifice, both of which were photographed. From this point, the colonoscope was slowly withdrawn taking circumferential views of the entire colonic mucosa stopping to photograph diverticulosis along the way, very significant in the sigmoid colon. Also in this area, approximately 20 cm from the anal verge was a small red sessile lesion which appeared to be polypoid. It was photographed and biopsied using hot biopsy forceps technique, setting of 20/20 blended current. A second polyp was seen in the rectum. It appeared to be somewhat more pedunculated and bilobular and this was photographed and it was removed as well using hot biopsy forceps technique. The remainder of the tissue was cauterized with the hot biopsy forceps. The endoscope was withdrawn revealing the rectum in direct view and in retroflex view. The area where the polyp was removed could be seen on direct view near a fold and a hemorrhoid was seen on retroflex view. The patients vital signs and pulse oximeter remained stable. The patient tolerated the procedure well without apparent complications.  FINDINGS:  Polyps of colon  removed.  PLAN:  Await biopsy report. The patient will call Dr. Virginia Rochester for results and follow up with Dr. Virginia Rochester as an outpatient. Dictated by:   Sabino Gasser, M.D. Attending Physician:  Sabino Gasser DD:  07/13/01 TD:  07/15/01 Job: [id number]

## 2010-08-26 ENCOUNTER — Other Ambulatory Visit: Payer: Self-pay | Admitting: Internal Medicine

## 2010-08-26 DIAGNOSIS — F0390 Unspecified dementia without behavioral disturbance: Secondary | ICD-10-CM

## 2010-08-30 ENCOUNTER — Inpatient Hospital Stay (HOSPITAL_COMMUNITY): Payer: Medicare Other

## 2010-08-30 ENCOUNTER — Other Ambulatory Visit: Payer: Self-pay

## 2010-08-30 ENCOUNTER — Inpatient Hospital Stay (HOSPITAL_COMMUNITY)
Admission: AD | Admit: 2010-08-30 | Discharge: 2010-09-06 | DRG: 057 | Disposition: A | Discharge status: Skilled Nursing Facility | Payer: Medicare Other | Source: Ambulatory Visit | Attending: Internal Medicine | Admitting: Internal Medicine

## 2010-08-30 DIAGNOSIS — R443 Hallucinations, unspecified: Secondary | ICD-10-CM | POA: Diagnosis present

## 2010-08-30 DIAGNOSIS — E785 Hyperlipidemia, unspecified: Secondary | ICD-10-CM | POA: Diagnosis present

## 2010-08-30 DIAGNOSIS — R279 Unspecified lack of coordination: Secondary | ICD-10-CM | POA: Diagnosis present

## 2010-08-30 DIAGNOSIS — G309 Alzheimer's disease, unspecified: Principal | ICD-10-CM | POA: Diagnosis present

## 2010-08-30 DIAGNOSIS — F02818 Dementia in other diseases classified elsewhere, unspecified severity, with other behavioral disturbance: Secondary | ICD-10-CM | POA: Diagnosis present

## 2010-08-30 DIAGNOSIS — N182 Chronic kidney disease, stage 2 (mild): Secondary | ICD-10-CM | POA: Diagnosis present

## 2010-08-30 DIAGNOSIS — N390 Urinary tract infection, site not specified: Secondary | ICD-10-CM | POA: Diagnosis present

## 2010-08-30 DIAGNOSIS — K219 Gastro-esophageal reflux disease without esophagitis: Secondary | ICD-10-CM | POA: Diagnosis present

## 2010-08-30 DIAGNOSIS — K5900 Constipation, unspecified: Secondary | ICD-10-CM | POA: Diagnosis not present

## 2010-08-30 DIAGNOSIS — Z9861 Coronary angioplasty status: Secondary | ICD-10-CM

## 2010-08-30 DIAGNOSIS — N400 Enlarged prostate without lower urinary tract symptoms: Secondary | ICD-10-CM | POA: Diagnosis present

## 2010-08-30 DIAGNOSIS — Z951 Presence of aortocoronary bypass graft: Secondary | ICD-10-CM

## 2010-08-30 DIAGNOSIS — F0281 Dementia in other diseases classified elsewhere with behavioral disturbance: Secondary | ICD-10-CM | POA: Diagnosis present

## 2010-08-30 DIAGNOSIS — I2510 Atherosclerotic heart disease of native coronary artery without angina pectoris: Secondary | ICD-10-CM | POA: Diagnosis present

## 2010-08-30 DIAGNOSIS — R269 Unspecified abnormalities of gait and mobility: Secondary | ICD-10-CM | POA: Diagnosis present

## 2010-08-30 DIAGNOSIS — F411 Generalized anxiety disorder: Secondary | ICD-10-CM | POA: Diagnosis not present

## 2010-08-30 DIAGNOSIS — F0280 Dementia in other diseases classified elsewhere without behavioral disturbance: Principal | ICD-10-CM | POA: Diagnosis present

## 2010-08-30 LAB — CBC
HCT: 40.2 % (ref 39.0–52.0)
Hemoglobin: 13.2 g/dL (ref 13.0–17.0)
MCH: 29.9 pg (ref 26.0–34.0)
MCHC: 32.8 g/dL (ref 30.0–36.0)
MCV: 91 fL (ref 78.0–100.0)
RBC: 4.42 MIL/uL (ref 4.22–5.81)

## 2010-08-30 LAB — COMPREHENSIVE METABOLIC PANEL
ALT: 10 U/L (ref 0–53)
AST: 17 U/L (ref 0–37)
CO2: 28 mEq/L (ref 19–32)
Calcium: 8.7 mg/dL (ref 8.4–10.5)
Chloride: 104 mEq/L (ref 96–112)
GFR calc non Af Amer: 52 mL/min — ABNORMAL LOW (ref >60)
Sodium: 140 mEq/L (ref 135–145)
Total Bilirubin: 0.5 mg/dL (ref 0.3–1.2)

## 2010-08-31 LAB — FOLATE: Folate: >20 ng/mL

## 2010-08-31 LAB — FREE PSA
PSA, Free Pct: 59 % (ref >25)
PSA, Free: 0.7 ng/mL

## 2010-08-31 LAB — ANTI-NUCLEAR AB-TITER (ANA TITER)

## 2010-09-03 LAB — URINE MICROSCOPIC-ADD ON

## 2010-09-03 LAB — URINALYSIS, ROUTINE W REFLEX MICROSCOPIC
Bilirubin Urine: NEGATIVE
Glucose, UA: NEGATIVE mg/dL
Hgb urine dipstick: NEGATIVE
Specific Gravity, Urine: 1.024 (ref 1.005–1.030)
Urobilinogen, UA: 1 mg/dL (ref 0.0–1.0)

## 2010-09-09 NOTE — Consult Note (Signed)
[name], [name]  MEDICAL RECORD NO.:  [id number]  LOCATION:  4507                         FACILITY:  [facility]  PHYSICIAN:  Thana Farr, MD    DATE OF BIRTH:  1929-12-21  DATE OF CONSULTATION:  08/31/2010 DATE OF DISCHARGE:                                CONSULTATION   REASON FOR CONSULTATION:  Dementia with hallucination.  HISTORY OF PRESENT ILLNESS:  This is a 77 year old Caucasian male with significant past medical history of CAD, CABG, thrombocytopenia, dementia with hallucinations, GERD, BPH, hyperlipidemia, renal insufficiency, aortic stenosis, and iliac stenosis.  The patient was diagnosed with dementia approximately 6 years ago per wife and since that time has had a steady decline.  Initially, the patient was started on Aricept 6 years ago and then followed by Namenda to which he has been on for 4 years.  As stated, the patient has had a steady decline but has been able to take part in his activities of daily living.  Over the past year, she has noticed a significant decline in his mentation capabilities and she has been taking care of his activities of daily living.  Over the past month, she has noticed a steeper decline and over the past 2 weeks has noticed that the patient is having visual hallucinations.  She denies any auditory hallucinations.  In addition to visual hallucinations, he has also become more combative and she has noticed some personality changes.  The patient was brought to [facility] for further evaluation and at that time Neurology was consulted to see the patient.  At the present time, the patient has received Seroquel for sedation, however, he is able to take part in the physical exam.  The patient is not alert and oriented to person, place, or time. He does recognize his wife, however.  PAST MEDICAL HISTORY:  As stated above.  MEDICATIONS:  The patient is on: 1. Namenda 10 mg p.o. b.i.d. 2.  Aricept 23 mg daily. 3. Colace. 4. Tylenol. 5. Aspirin. 6. Axona. 7. Folic acid.  ALLERGIES:  No known drug allergies.  SOCIAL HISTORY:  The patient lives with his wife.  He is married.  He is retired from [employer].  He does smoke.  He does not drink alcohol or do illicit drugs.  REVIEW OF SYSTEMS:  Negative with the exception above.  PHYSICAL EXAMINATION:  VITAL SIGNS:  Blood pressure is 141/74, pulse 83, respirations 20, and temperature 98.0. GENERAL:  The patient as stated is alert but not oriented to person, place, and time. HEENT:  Pupils are equal, round, and reactive to light and accommodation.  Conjugate gaze.  Extraocular movements are intact. Visual fields are grossly intact.  Face is symmetrical.  Tongue is midline.  Uvula is midline. NEUROLOGIC:  Facial and body sensation is grossly intact to pinprick and light touch.  Coordination:  Finger-to-nose is smooth.  Heel-to-shin is smooth.  The patient's strength is 4/5 throughout.  He does have some difficulty raising his right shoulder secondary to a fall.  Deep tendon reflexes are 1+ throughout.  He has downgoing toe on the right and upgoing toe on the left.  The patient has negative drift in the upper or lower extremities. PULMONARY:  Clear to auscultation. CARDIOVASCULAR:  S1-S2. NECK:  Negative for bruits.  LABORATORY DATA:  ANA is positive.  Sodium 140, potassium 4.1, chloride 104, CO2 28, BUN 26, creatinine 1.34, and glucose 99.  White blood cell count 5.2, platelets 110, hemoglobin 30.2, and hematocrit 40.2.  IMAGING:  CT head shows negative for mass or bleed.  It does show global atrophy.  ASSESSMENT:  This is a 77 year old Caucasian male with a 6-year history of dementia who was admitted on August 30, 2010.  As an outpatient, the patient has been on Aricept, Axona, and Namenda.  Over the past 4 weeks, he shown a decline in his mentation and over the past week started to develop visual hallucinations.  Medical management is recommended.  RECOMMENDATIONS:   1. Agree with continued use of Aricept, Namenda, and Axona.   2. Would continue use of Seroquel but would change to a scheduled dosage and not use prn.  Dose of 25 mg p.o. at bedtime is appropriate and may be adjusted upward as necessary. 3. Patient should follow up as an outpatient in a Neurology Clinic once disposition has been determined.     Felicie Morn, PA-C   ______________________________ Thana Farr, MD    DS/MEDQ  D:  08/31/2010  T:  09/01/2010  Job:  [id number]  Electronically Signed by Felicie Morn PA-C on 09/03/2010 07:43:41 AM Electronically Signed by Thana Farr MD on 09/09/2010 10:46:27 AM

## 2010-10-23 NOTE — Discharge Summary (Signed)
NAMEBRYTEN, Jose NO.:  [id number]  MEDICAL RECORD NO.:  [id number]  LOCATION:  4507                         FACILITY:  [facility]  PHYSICIAN:  Massie Maroon, MD        DATE OF BIRTH:  1929-11-01  DATE OF ADMISSION:  08/30/2010 DATE OF DISCHARGE:  09/06/2010                              DISCHARGE SUMMARY   DISCHARGE DIAGNOSES: 1. Altered mental status secondary to worsening dementia and     hallucinations secondary to dementia. 2. Alzheimer's dementia. 3. Urinary tract infection. 4. Coronary artery disease status post PCI 2001 for single-vessel     complicated by dissection in the left anterior descending required     emergent coronary artery bypass graft with left internal mammary     artery to the left anterior descending and saphenous vein graft to     patent grafts by cath in May 2010 with high-grade lesion in the     left main and proximal circumflex felt to be not amenable to     percutaneous coronary intervention and he was not felt to be a CABG     candidate in the setting of non-ST elevation MI in May 2010.  The     patient when admission June 19, 2009 for intractable angina and     underwent cardiac catheterization June 23, 2010 which involved     high risk percutaneous coronary intervention including high-speed     rotational atherectomy, cutting balloon atherectomy, percutaneous     transluminal coronary angioplasty and ultimately stenting with a 3     x 15 mm DES Promus post dilation 3.25 mm.  There is also noted to     be a 80-90% right common iliac stenosis and 99% left main stenosis. 5. Aortic stenosis status post aortic valve replacement with a     bioprosthetic valve. 6. Chronic kidney disease stage II with a baseline creatinine of 1.3. 7. Hyperlipidemia. 8. Gastroesophageal reflux disease. 9. Thrombocytopenia. 10.Benign prostatic hypertrophy.  PAST SURGICAL HISTORY:  Please see HPI dictated (for admission August 30, 2010).  DISCHARGE  MEDICATIONS: 1. Ciprofloxacin 500 mg p.o. b.i.d. x5 days for UTI. 2. MiraLax 17 grams p.o. daily p.r.n. constipation. 3. Quetiapine 100 mg p.o. at bedtime for hallucinations. 4. Axona 1/2 packet p.o. daily. 5. Tylenol 650 mg p.o. q.4 h. p.r.n. 6. Aricept 23 mg p.o. at bedtime. 7. Enteric-coated aspirin 81 mg p.o. at bedtime. 8. Colace 100 mg p.o. b.i.d. 9. Folic acid 1 mg p.o. daily. 10.Namenda 10 mg p.o. b.i.d. 11.Sublingual nitroglycerin 0.4 mg q.5 minutes p.r.n. up to 3 doses     for chest pain. 12.Vitamin D 400 international units 1 p.o. daily.  HOSPITAL COURSE:  This is a pleasant 77 year old male with history of worsening dementia and hallucinations primarily visual.  He was started on Seroquel in regards to his hallucinations and this seems to have help improve somewhat.  He was also started on Ativan 0.5 mg p.o. q.8 h. p.r.n. anxiety and this appears to help with his agitation, however, he becomes very sleepy with this medication.  He is obviously at fall risk and this needs to be assessed when he  enters the nursing home.  Needs fall risk precautions taken.  He is required a sitter during this stay in order to help him with his fall risk.  The patient appears to be stable on the medications that he is taking at the present time. Neurology was consulted and they thought that Seroquel would be the best route to decrease his hallucinations.  The patient has become more aware and now recognized his wife and during the course of his stay, a urinalysis was performed and he was found to have mild UTI.  Being on the Cipro, it does not seem to make a difference in his altered mental status, however.  The patient's vital signs have been good and he is not presently on Crestor which he was on previously for his cholesterol due to the fact that Dr. Royann Shivers, his cardiologist did not think that it would be of any benefit at this point in time due to his dementia.  The patient's family  [facility] son [facility], however, he is amendable to other nursing homes as well and I have asked social work and case management to arrange transfer to a skilled nursing facility which I think would be appropriate in light of his dementia and deconditioning.  DIET:  Cardiac diet.  PHYSICAL EXAMINATION:  VITAL SIGNS:  Temperature 97.7, pulse 68, blood pressure 130/78, pulse ox is 97% on room air. HEENT:  Anicteric. NECK:  No JVD. HEART:  Regular rate and rhythm, S1, S2. LUNGS:  CTAB. ABDOMEN:  Soft. EXTREMITIES:  No cyanosis, clubbing, or edema. SKIN:  No rashes.  There was slight skin tear on the right forearm where he had an IV.  LABORATORY DATA:  Urinalysis:  WBCs 21-50.  Urine culture is not back yet.  Labs August 30, 2010, WBC 5.2, hemoglobin 13.2, platelet count 110, sodium 140, potassium 4.1, BUN 26, creatinine 1.34, AST 17, ALT 10, ESR 1.  Vitamin B12 630, folic acid greater than 20.  RPR negative, PSA 1.19, ANA positive with a 1:40 titer.  CT brain August 30, 2010 showed no acute intracranial abnormality and atrophy.  Chest x-ray August 30, 2010 showed no acute cardiopulmonary disease.  ASSESSMENT/PLAN: 1. Dementia with hallucinations.  I will continue Seroquel 100 mg p.o.     at bedtime for now.  We will decrease Ativan to 0.25 mg p.o. q.8 h.     p.r.n. agitation due to it being too sedative at the 0.5 mg dose.     We appreciate neurology input.  The patient will continue Axona as     well as Aricept and Namenda.  If he continues to have     hallucinations and appears to be somnolent, maybe Aricept can be     cut back a little bit to 10 mg p.o. daily. 2. Urinary tract infection:  Continue ciprofloxacin 500 mg p.o. b.i.d.     x5 more days. 3. Coronary artery disease.  Continue aspirin.  Note that his Crestor     was discontinued by his cardiologist, Dr.     Royann Shivers because he thought that there had been minimal benefit and      that it might be affecting his memory. 4. Constipation:  The patient was started on Colace 100 mg p.o. b.i.d.     and started on MiraLax p.r.n. for constipation.     Massie Maroon, MD     JYK/MEDQ  D:  09/06/2010  T:  09/06/2010  Job:  [id number]  Electronically Signed by Pearson Grippe MD on 10/23/2010 02:24:04 PM

## 2010-10-23 NOTE — H&P (Signed)
NAMEDONIVIN, [name]  MEDICAL RECORD NO.:  [id number]  LOCATION:  4507                         FACILITY:  [facility]  PHYSICIAN:  Massie Maroon, MD        DATE OF BIRTH:  1929-06-29  DATE OF ADMISSION:  08/30/2010 DATE OF DISCHARGE:                             HISTORY & PHYSICAL   CHIEF COMPLAINT:  Altered mental status.  HISTORY OF PRESENT ILLNESS:  This is a 77 year old male with a history of CAD status post PCI in 2000 for single-vessel disease complicated by dissection of the LAD for which he emergently underwent CABG and had a LIMA to the LAD, saphenous venous graft to the diagonal.  Cath in May 2010 in which he had high-grade lesions in the left main and proximal circ not amenable to PCI.  He was seen in the past by CT surgery but not felt to be a candidate due to dementia.  On June 22, 2009, he underwent a long complex intervention by Dr. Tresa Endo complicated by bleeding from prophylactic intra-aortic balloon pump which caused stopping of Integrilin and ultimately heparin.  Since his intervention on June 22, 2009, he has made improvement and not had symptoms of angina.  The patient is evaluated today due to complaints of altered mental status and progressive dementia.  He has been taken off his Crestor and some of his other cardiac medications due to the fact that Dr. Royann Shivers thought that his dementia was worsening and there was some family concern that Crestor could be causing marked change to memory problems.  The patient has apparently had some visual hallucinations over the last several weeks.  This has been worse.  The patient has also had trouble with his gait.  It has been shuffling but apparently over recent days has improved.  He was seen in the office at which time he was having difficulty with ambulation but the main focus of his family is on his progressive memory loss as well as question of hallucination that he does not appear  to be actively hallucinating today in front of me.  The patient's family is concerned about his safety and well being and believes that he needs nursing home placement and requests that he be admitted for workup of altered mental status.  PAST MEDICAL HISTORY: 1. CAD status post PCI in 2011 for single-vessel disease complicated     by dissection in the LAD requiring emergent CABG with LIMA to the     LAD and SVG to diagonal, patent grafts by cath in May 2010 with     high-grade lesion in the left main and proximal circumflex felt to     be not amenable to PCI and also he was not felt to be a CABG     candidate in the setting of non-ST-elevation MI in May 2010.  The     patient underwent admission on June 19, 2009 for intractable     angina and underwent cardiac catheterization on June 23, 2010     which involved high-risk percutaneous coronary artery intervention     including high-speed rotational atherectomy, cutting balloon     atherotomy,  PTCA, and ultimate stenting with a 3 x 15 mm DES Promus     post dilation 3.25 mm.  There was also noted to be an 80-90% right     common iliac stenosis and 99% left main stenosis after successful     intervention. 2. Aortic stenosis status post aortic valve replacement with a     bioprosthetic valve. 3. Chronic renal insufficiency with baseline creatinine of 1.3. 4. Hyperlipidemia. 5. BPH. 6. GERD. 7. Thrombocytopenia. 8. Dementia.  PAST SURGICAL HISTORY: 1. Cardiac catheterization, January 20, 1999 by Dr. Charolette Child,     single-vessel CAD, critical restenosis within the mid LAD stent,     status post successful angioplasty of the mid LAD stent complicated     by severe dissection distal to the circ. 2. January 20, 1999, CABG x2 with LIMA to the LAD and reverse     saphenous venous graft to the diagonal and replacement of aortic     valve with a # 21 pericardial tissue valve by Dr. Sheliah Plane. 3. May 16, 2000, cardiac  catheterization.  Severe right renal artery     stenosis and total occlusion of the proximal LAD. 4. June 26, 2000, cardiac catheterization.  Critical stenosis of the     right renal artery, mild stenosis of the left renal artery 20%,     successful angioplasty with stent placement in the right renal     artery. 5. Jul 13, 2001, EGD.  Erythema of the body of the stomach. 6. Jul 13, 2001, colonoscopy.  Polyps removed - hyperplastic polyps, no     adenomatous changes. 7. April 30, 2004, release of right finger A1 pulley by Dr. Josephine Igo for tenosynovitis of the right ring finger. 8. Jul 26, 2005, release of left long finger A1 pulley by Dr. Josephine Igo for chronic stenosis and tenosynovitis of the left long     finger. 9. January 11, 2006, posterior lumbar interbody arthrodesis, L4-L5     with two 11-mm PEEK synthesis cages filled with morselized     allograft and posterolateral arthrodesis of L4-L5, spinal plate L4-     L5 with lumbar stenosis L4-L5, degenerative spondylosis L4-L5,     lumbar radiculopathy. 10.November 07, 2006, cystoscopy and transurethral resection of the     prostate by Dr. Marcelyn Bruins. 11.Aug 01, 2008, cardiac catheterization.  Severe native coronary     artery disease with subtotal occlusion of the left main, subtotal     occlusion of the left circumflex, and subtotal occlusion of the LAD     with a patent saphenous graft to the diagonal and patent LIMA to     the LAD. 12.June 22, 2009, cardiac catheterization by Dr. Nicki Guadalajara, see     above past medical history.  SOCIAL HISTORY:  The patient lives with his wife.  He does not smoke or drink at present.  He has a history of tobacco abuse in the past.  He is retired from [employer].  FAMILY HISTORY:  Mother had leukemia, father had heart disease, and prostate cancer in his brother.  ALLERGIES:  No known drug allergies.  MEDICATIONS: 1. Axona. 2. Folic acid 1 mg p.o. daily. 3.  Sublingual nitroglycerin 0.4 mg p.o. q.5 minutes p.r.n. chest pain. 4. Enteric-coated aspirin 81 mg p.o. daily. 5. Tylenol 325 mg 2 p.o. q.4 h. p.r.n. 6. Namenda 10 mg p.o. b.i.d. 7. Aricept 23 mg p.o. daily. 8. Colace  100 mg p.o. b.i.d.  REVIEW OF SYSTEMS:  Negative for fever, chills, negative for all 10 organ systems except for pertinent positives stated above.  PHYSICAL EXAMINATION:  VITAL SIGNS:  Temperature 97.2, pulse 75, blood pressure 158/84, and pulse ox is 99% on room air. HEENT:  Anicteric. NECK:  No JVD. HEART:  Midline scar.  Regular rate and rhythm.  S1-S2. LUNGS:  CTAP. ABDOMEN:  Soft. EXTREMITIES:  No cyanosis, clubbing, or edema. SKIN:  No rashes. LYMPH NODES:  No adenopathy. NEUROLOGIC:  Nonfocal.  No resting tremor.  LABORATORY DATA:  Sodium 140, potassium 4.1, BUN 26, and creatinine 1.24.  AST 17 and ALT 10.  WBC 5.2, hemoglobin 13.2, and platelet count is 110.  Chest x-ray negative for any acute process.  CT brain negative for any acute process.  ASSESSMENT AND PLAN: 1. Altered mental status, hallucinations likely secondary to     progressive dementia:  Continue Namenda.  Continue Aricept.  CT     brain noncontrast, results above.  Check B12, folate, ESR, ANA, RPR     (TSH done in office normal), chest x-ray, urinalysis.  Consider a     trial of Seroquel 50 mg p.o. at bedtime for hallucination,     agitation; Ativan 0.5 mg p.o. t.i.d. p.r.n. agitation.  Continue     Axona.  The patient will need nursing home placement as per     family's wishes.  Consider Neurology consult in the a.m. for     further evaluation of altered mental status and progressive     dementia to see if they have any suggestions. 2. Coronary artery disease status post coronary artery bypass graft,     percutaneous transluminal coronary angioplasty/stent:  The patient     will continue on aspirin.  He is off of his Crestor due to the fact     that there were concerns that it might  contribute to his dementia. 3. Deep venous thrombosis prophylaxis.  Sequential compression     devices.     Massie Maroon, MD     JYK/MEDQ  D:  08/31/2010  T:  08/31/2010  Job:  [id number]  Electronically Signed by Pearson Grippe MD on 10/23/2010 02:23:55 PM

## 2010-12-17 LAB — BASIC METABOLIC PANEL
BUN: 21
CO2: 28
Chloride: 106
GFR calc Af Amer: >60
GFR calc non Af Amer: 52 — ABNORMAL LOW
Glucose, Bld: 115 — ABNORMAL HIGH
Glucose, Bld: 112 — ABNORMAL HIGH
Potassium: 4.1
Potassium: 5.1
Sodium: 134 — ABNORMAL LOW
Sodium: 140

## 2010-12-17 LAB — CBC
HCT: 34.9 — ABNORMAL LOW
HCT: 40.5
Hemoglobin: 12 — ABNORMAL LOW
Hemoglobin: 13.8
MCV: 89.9
Platelets: 119 — ABNORMAL LOW
RBC: 3.85 — ABNORMAL LOW
RDW: 13.3
WBC: 7

## 2011-02-06 ENCOUNTER — Emergency Department (HOSPITAL_COMMUNITY): Payer: Medicare Other

## 2011-02-06 ENCOUNTER — Inpatient Hospital Stay (HOSPITAL_COMMUNITY)
Admission: EM | Admit: 2011-02-06 | Discharge: 2011-02-10 | DRG: 603 | Disposition: A | Discharge status: Skilled Nursing Facility | Payer: Medicare Other | Attending: Internal Medicine | Admitting: Internal Medicine

## 2011-02-06 ENCOUNTER — Encounter: Payer: Self-pay | Admitting: Emergency Medicine

## 2011-02-06 DIAGNOSIS — N183 Chronic kidney disease, stage 3 unspecified: Secondary | ICD-10-CM | POA: Diagnosis present

## 2011-02-06 DIAGNOSIS — Z951 Presence of aortocoronary bypass graft: Secondary | ICD-10-CM

## 2011-02-06 DIAGNOSIS — R1310 Dysphagia, unspecified: Secondary | ICD-10-CM | POA: Diagnosis present

## 2011-02-06 DIAGNOSIS — R4182 Altered mental status, unspecified: Secondary | ICD-10-CM | POA: Diagnosis present

## 2011-02-06 DIAGNOSIS — N179 Acute kidney failure, unspecified: Secondary | ICD-10-CM | POA: Diagnosis present

## 2011-02-06 DIAGNOSIS — L02419 Cutaneous abscess of limb, unspecified: Principal | ICD-10-CM | POA: Diagnosis present

## 2011-02-06 DIAGNOSIS — L0390 Cellulitis, unspecified: Secondary | ICD-10-CM | POA: Diagnosis present

## 2011-02-06 DIAGNOSIS — N400 Enlarged prostate without lower urinary tract symptoms: Secondary | ICD-10-CM | POA: Diagnosis present

## 2011-02-06 DIAGNOSIS — G309 Alzheimer's disease, unspecified: Secondary | ICD-10-CM | POA: Diagnosis present

## 2011-02-06 DIAGNOSIS — E876 Hypokalemia: Secondary | ICD-10-CM | POA: Diagnosis not present

## 2011-02-06 DIAGNOSIS — I2510 Atherosclerotic heart disease of native coronary artery without angina pectoris: Secondary | ICD-10-CM | POA: Diagnosis present

## 2011-02-06 DIAGNOSIS — F0280 Dementia in other diseases classified elsewhere without behavioral disturbance: Secondary | ICD-10-CM | POA: Diagnosis present

## 2011-02-06 DIAGNOSIS — Z954 Presence of other heart-valve replacement: Secondary | ICD-10-CM

## 2011-02-06 DIAGNOSIS — L03119 Cellulitis of unspecified part of limb: Principal | ICD-10-CM | POA: Diagnosis present

## 2011-02-06 DIAGNOSIS — R509 Fever, unspecified: Secondary | ICD-10-CM | POA: Diagnosis present

## 2011-02-06 HISTORY — DX: Personal history of other diseases of the circulatory system: Z86.79

## 2011-02-06 HISTORY — DX: Atherosclerotic heart disease of native coronary artery without angina pectoris: I25.10

## 2011-02-06 HISTORY — DX: Thrombocytopenia, unspecified: D69.6

## 2011-02-06 HISTORY — DX: Gastro-esophageal reflux disease without esophagitis: K21.9

## 2011-02-06 HISTORY — DX: Benign prostatic hyperplasia without lower urinary tract symptoms: N40.0

## 2011-02-06 HISTORY — DX: Hyperlipidemia, unspecified: E78.5

## 2011-02-06 HISTORY — DX: Alzheimer's disease, unspecified: F02.80

## 2011-02-06 HISTORY — DX: Alzheimer's disease, unspecified: G30.9

## 2011-02-06 HISTORY — DX: Unspecified dementia, unspecified severity, without behavioral disturbance, psychotic disturbance, mood disturbance, and anxiety: F03.90

## 2011-02-06 LAB — BASIC METABOLIC PANEL
BUN: 26 mg/dL — ABNORMAL HIGH (ref 6–23)
Calcium: 9 mg/dL (ref 8.4–10.5)
GFR calc Af Amer: 51 mL/min — ABNORMAL LOW (ref >90)
GFR calc non Af Amer: 44 mL/min — ABNORMAL LOW (ref >90)
Glucose, Bld: 115 mg/dL — ABNORMAL HIGH (ref 70–99)
Potassium: 4.8 mEq/L (ref 3.5–5.1)
Sodium: 141 mEq/L (ref 135–145)

## 2011-02-06 LAB — URINALYSIS, ROUTINE W REFLEX MICROSCOPIC
Glucose, UA: NEGATIVE mg/dL
Hgb urine dipstick: NEGATIVE
Protein, ur: 30 mg/dL — AB
Urobilinogen, UA: 1 mg/dL (ref 0.0–1.0)

## 2011-02-06 LAB — LACTIC ACID, PLASMA: Lactic Acid, Venous: 1.1 mmol/L (ref 0.5–2.2)

## 2011-02-06 LAB — URINE MICROSCOPIC-ADD ON

## 2011-02-06 LAB — CBC
HCT: 40.2 % (ref 39.0–52.0)
MCH: 29.5 pg (ref 26.0–34.0)
MCHC: 32.6 g/dL (ref 30.0–36.0)
RDW: 14.5 % (ref 11.5–15.5)

## 2011-02-06 MED ORDER — MORPHINE SULFATE 2 MG/ML IJ SOLN
0.5000 mg | INTRAMUSCULAR | Status: DC | PRN
  Administered 2011-02-06 – 2011-02-07 (×2): 1 mg via INTRAVENOUS
  Administered 2011-02-08: 0.5 mg via INTRAVENOUS
  Filled 2011-02-06 (×3): qty 1

## 2011-02-06 MED ORDER — CHOLECALCIFEROL 10 MCG (400 UNIT) PO TABS
400.0000 [IU] | ORAL_TABLET | Freq: Every day | ORAL | Status: DC
  Administered 2011-02-07 – 2011-02-10 (×4): 400 [IU] via ORAL
  Filled 2011-02-06 (×4): qty 1

## 2011-02-06 MED ORDER — FOLIC ACID 1 MG PO TABS
1.0000 mg | ORAL_TABLET | ORAL | Status: DC
  Administered 2011-02-07 – 2011-02-10 (×3): 1 mg via ORAL
  Filled 2011-02-06 (×4): qty 1

## 2011-02-06 MED ORDER — QUETIAPINE FUMARATE 100 MG PO TABS
100.0000 mg | ORAL_TABLET | Freq: Every day | ORAL | Status: DC
  Administered 2011-02-07 – 2011-02-10 (×3): 100 mg via ORAL
  Filled 2011-02-06 (×4): qty 1

## 2011-02-06 MED ORDER — ENOXAPARIN SODIUM 30 MG/0.3ML ~~LOC~~ SOLN
30.0000 mg | SUBCUTANEOUS | Status: DC
  Administered 2011-02-06 – 2011-02-07 (×2): 30 mg via SUBCUTANEOUS
  Filled 2011-02-06 (×3): qty 0.3

## 2011-02-06 MED ORDER — IPRATROPIUM BROMIDE 0.02 % IN SOLN
0.5000 mg | Freq: Four times a day (QID) | RESPIRATORY_TRACT | Status: DC
  Administered 2011-02-07 – 2011-02-08 (×5): 0.5 mg via RESPIRATORY_TRACT
  Filled 2011-02-06 (×5): qty 2.5

## 2011-02-06 MED ORDER — MEMANTINE HCL 10 MG PO TABS
10.0000 mg | ORAL_TABLET | Freq: Two times a day (BID) | ORAL | Status: DC
  Administered 2011-02-07 – 2011-02-10 (×7): 10 mg via ORAL
  Filled 2011-02-06 (×9): qty 1

## 2011-02-06 MED ORDER — ASPIRIN 81 MG PO CHEW
81.0000 mg | CHEWABLE_TABLET | Freq: Every day | ORAL | Status: DC
  Administered 2011-02-07 – 2011-02-10 (×3): 81 mg via ORAL
  Filled 2011-02-06 (×4): qty 1

## 2011-02-06 MED ORDER — ALBUTEROL SULFATE (5 MG/ML) 0.5% IN NEBU
2.5000 mg | INHALATION_SOLUTION | Freq: Four times a day (QID) | RESPIRATORY_TRACT | Status: DC
  Administered 2011-02-07 – 2011-02-08 (×5): 2.5 mg via RESPIRATORY_TRACT
  Filled 2011-02-06 (×5): qty 0.5

## 2011-02-06 MED ORDER — POLYETHYLENE GLYCOL 3350 17 G PO PACK
17.0000 g | PACK | Freq: Every day | ORAL | Status: DC
  Administered 2011-02-08 – 2011-02-10 (×2): 17 g via ORAL
  Filled 2011-02-06 (×4): qty 1

## 2011-02-06 MED ORDER — LORAZEPAM 0.5 MG PO TABS: 0.5000 mg | ORAL_TABLET | Freq: Three times a day (TID) | ORAL | Status: DC | PRN

## 2011-02-06 MED ORDER — NITROGLYCERIN 0.4 MG SL SUBL: 0.4000 mg | SUBLINGUAL_TABLET | SUBLINGUAL | Status: DC | PRN

## 2011-02-06 MED ORDER — SODIUM CHLORIDE 0.9 % IV SOLN
INTRAVENOUS | Status: DC
  Administered 2011-02-06 – 2011-02-07 (×2): via INTRAVENOUS

## 2011-02-06 MED ORDER — ACETAMINOPHEN 650 MG RE SUPP
650.0000 mg | Freq: Once | RECTAL | Status: AC
  Administered 2011-02-06: 650 mg via RECTAL
  Filled 2011-02-06: qty 1

## 2011-02-06 MED ORDER — DONEPEZIL HCL 23 MG PO TABS
23.0000 mg | ORAL_TABLET | Freq: Every day | ORAL | Status: DC
  Administered 2011-02-07 – 2011-02-10 (×3): 23 mg via ORAL
  Filled 2011-02-06 (×4): qty 1

## 2011-02-06 MED ORDER — DOCUSATE SODIUM 100 MG PO CAPS
100.0000 mg | ORAL_CAPSULE | Freq: Two times a day (BID) | ORAL | Status: DC
  Administered 2011-02-08 – 2011-02-10 (×3): 100 mg via ORAL
  Filled 2011-02-06 (×9): qty 1

## 2011-02-06 NOTE — H&P (Signed)
Hospital Admission Note Date: 02/06/2011  Patient name: [name]           Medical record number: [id number] Date of birth: December 04, 1929           Age: 77 y.o.   Gender: male    PCP:   Pearson Grippe, MD, MD   Chief Complaint:  Altered mental status  HPI: [name] is a 77 y.o. male with history of Alzheimer dementia and benign prostatic hyperplasia. Patient is present in the memory unit in [facility]. Patient brought to the hospital because he is not behaving as he usually does. His wife was in the room and provided the history. Since yesterday patient was slipping off of his wheelchair, and when he does not usually he is not well. Patient was unable to sit upright, and he mumbles when he talks. Patient also was not interested in food. So patient brought to the hospital for further evaluation. Upon initial evaluation in the emergency department, his temperature is 100.1, he has clear chest x-ray and his urinalysis shows questionable UTI. Patient will be admitted to the hospital for further evaluation  Past Medical History: Past Medical History  Diagnosis Date  . GERD (gastroesophageal reflux disease)   . Alzheimer's dementia   . Dementia   . Coronary artery disease   . Hyperlipidemia   . BPH (benign prostatic hyperplasia)   . Thrombocytopenia   . History of aortic stenosis     Status post AVR with bioprosthetic valve   Past Surgical History  Procedure Date  . Coronary artery bypass graft January 20, 1999    Medications: Prior to Admission medications   Medication Sig Start Date End Date Taking? Authorizing Provider  acetaminophen (MAPAP) 325 MG tablet Take 650 mg by mouth every 4 (four) hours as needed. For pain. Not to exceed 4 grams in 24 hours.    Yes Historical Provider, MD  aspirin 81 MG chewable tablet Chew 81 mg by mouth at bedtime.     Yes Historical Provider, MD  docusate sodium (COLACE) 100 MG capsule Take 100 mg by mouth 2 (two) times daily.     Yes  Historical Provider, MD  donepezil (ARICEPT) 23 MG TABS tablet Take 23 mg by mouth at bedtime.     Yes Historical Provider, MD  folic acid (FOLVITE) 1 MG tablet Take 1 mg by mouth every morning.     Yes Historical Provider, MD  LORazepam (ATIVAN) 0.5 MG tablet Take 0.5 mg by mouth every 8 (eight) hours as needed. For anxiety/agitation.    Yes Historical Provider, MD  memantine (NAMENDA) 10 MG tablet Take 10 mg by mouth 2 (two) times daily.     Yes Historical Provider, MD  nitroGLYCERIN (NITROSTAT) 0.4 MG SL tablet Place 0.4 mg under the tongue every 5 (five) minutes as needed. For chest pain.    Yes Historical Provider, MD  polyethylene glycol (MIRALAX / GLYCOLAX) packet Take 17 g by mouth daily as needed. Mix in liquid. For constipation.    Yes Historical Provider, MD  PRESCRIPTION MEDICATION Take 30 mLs by mouth 2 (two) times daily. Pro-stat 101 Sugar Free 15 Gram 101/30 Liquid.    Yes Historical Provider, MD  QUEtiapine (SEROQUEL) 100 MG tablet Take 100 mg by mouth at bedtime.     Yes Historical Provider, MD  vitamin D, CHOLECALCIFEROL, 400 UNITS tablet Take 400 Units by mouth every morning.     Yes Historical Provider, MD    Allergies:  No  Known Allergies  Social History:  reports that he has quit smoking. His smoking use included Cigarettes. He has never used smokeless tobacco. His alcohol and drug histories not on file.  Family History: No family history on file.  Review of Systems:  Unobtainable because of the confusion   Physical Exam: BP 111/67  Pulse 81  Temp(Src) 100.1 F (37.8 C) (Rectal)  Resp 13  SpO2 100% General appearance: alert, no distress his wife holding his hands  Head: Normocephalic, without obvious abnormality, atraumatic  Eyes: conjunctivae/corneas clear. PERRL, EOM's intact. Fundi benign.  Nose: Nares normal. Septum midline. Mucosa normal. No drainage or sinus tenderness.  Throat: lips, mucosa, and tongue normal; teeth and gums normal  Neck: Supple, no  masses, no cervical lymphadenopathy, no JVD appreciated, no meningeal signs Resp: clear to auscultation bilaterally  Chest wall: no tenderness  Cardio: regular rate and rhythm, S1, S2 normal, no murmur, click, rub or gallop  GI: soft, non-tender; bowel sounds normal; no masses, no organomegaly  Extremities: extremities normal, atraumatic, no cyanosis or edema  Skin: Skin color, texture, turgor normal. No rashes or lesions  Neurologic: Alert, disoriented, normal strength and tone. Normal symmetric reflexes. Normal coordination and gait  Labs on Admission:   Basename 02/06/11 1100  NA 141  K 4.8  CL 105  CO2 29  GLUCOSE 115*  BUN 26*  CREATININE 1.47*  CALCIUM 9.0  MG --  PHOS --    Basename 02/06/11 1100  WBC 8.2  NEUTROABS --  HGB 13.1  HCT 40.2  MCV 90.5  PLT 128*    Basename 02/06/11 1100  CKTOTAL --  CKMB --  CKMBINDEX --  TROPONINI <0.30   Radiological Exams on Admission: Dg Chest 2 View  02/06/2011  *RADIOLOGY REPORT*  Clinical Data: Altered mental status.  CHEST - 2 VIEW  Comparison: 25 12  Findings: Two views of the chest were obtained.  The patient has a coronary artery stents.  Prominent lung markings that suggest chronic changes.  There is no focal airspace disease.  The trachea is midline. Status post median sternotomy and cardiac valve surgery.  IMPRESSION: No acute chest findings.  Original Report Authenticated By: Richarda Overlie, M.D.     IMPRESSION: Present on Admission:  .Fever .Altered mental status .Alzheimer's dementia .BPH (benign prostatic hyperplasia) .Chronic kidney disease, stage III (moderate)  Assessment/Plan  1. Altered mental status: Patient has baseline dementia and was disorientation, but since yesterday he has some changes over his baseline. Patient has fever but otherwise his chest x-ray and urinalysis were negative. This is might be secondary to infection.  2. Fever: This is likely causing his altered mental status. So far no source  for the fever. I will obtain blood culture, influenza swab. I will start patient empirically on Rocephin.  3. Alzheimer's dementia with history of hallucinations: The patient is on Seroquel we'll continue on that.  4. Chronic kidney disease stage III: This is stable, patient will be hydrated we'll followup on the renal function.   Cattleya Dobratz A 02/06/2011, 2:34 PM

## 2011-02-06 NOTE — ED Provider Notes (Signed)
Medical screening examination/treatment/procedure(s) were conducted as a shared visit with non-physician practitioner(s) and myself.  I personally evaluated the patient during the encounter 77 year old male, with profound dementia, was sent from the nursing home.  Due to a change in his behavior.  Notably.  He has a fever.  Otherwise, he is unable to cooperate with the examination.  Because of his profound dementia, and chronic illness.  The patient is breathing through his mouth with a, wide-open.  He's got obvious dry.  Oropharynx is heart and lungs are clear.  His abdomen is soft.  We will perform testing, to try to determine the source of his fever, and likely admit him to the hospital for  Nicholes Stairs, MD 02/06/11 1347

## 2011-02-06 NOTE — ED Provider Notes (Signed)
History     CSN: [id number] Arrival date & time: 02/06/2011 10:52 AM   First MD Initiated Contact with Patient 02/06/11 1058      Chief Complaint  Patient presents with  . Altered Mental Status    per staff at sunrise  . Shaking    (Consider location/radiation/quality/duration/timing/severity/associated sxs/prior treatment) HPI History provided by nursing home staff at [facility] where he is a resident of memory care unit.  H/o alzheimer's.  Pt has been sliding down in his wheelchair since yesterday; unable to sit upright.  This morning he was having intermittent spells of shaking and hollering.   At baseline, he sits upright in his wheelchair, is able to ambulate w/ assistance, speaks clearly and understand when others are talking to him.  He has not had any recent illness including fever, coughing, vomiting, diarrhea, urinary sx.  Has not complained of pain anywhere.   No recent falls.  No recent medication changes.    Past Medical History  Diagnosis Date  . GERD (gastroesophageal reflux disease)   . Alzheimer disease   . Dementia   . Coronary artery disease   . Hyperlipidemia   . BPH (benign prostatic hyperplasia)     No past surgical history on file.  No family history on file.  History  Substance Use Topics  . Smoking status: Not on file  . Smokeless tobacco: Not on file  . Alcohol Use:       Review of Systems  All other systems reviewed and are negative.    Allergies  Review of patient's allergies indicates no known allergies.  Home Medications  No current outpatient prescriptions on file.  BP 111/67  Pulse 81  Temp(Src) 98.5 F (36.9 C) (Oral)  Resp 13  SpO2 98%  Physical Exam  Nursing note and vitals reviewed. Constitutional: He appears well-developed. No distress.       Temp 100.1 and VS otherwise w/in nml limits  HENT:  Head: Normocephalic and atraumatic.       Dry mucous membranes  Eyes:       Normal appearance  Neck: Normal range  of motion.  Cardiovascular: Normal rate and regular rhythm.   Pulmonary/Chest: Effort normal and breath sounds normal. No respiratory distress.       O2 sat 100% rm air  Abdominal: Soft. Bowel sounds are normal. He exhibits no distension.       Pt does not appear uncomfortable w/ palpation  Musculoskeletal:       2+ radial and DP pulses.  Hands and feet warm.   Full active ROM upper extremities.  Lymphadenopathy:    He has no cervical adenopathy.  Neurological: He is alert.       Pt does not make eye contact.  Does not follow commands.   Words are mumbled.   Intermittent tremors.   Skin: Skin is warm and dry. No rash noted.  Psychiatric: He has a normal mood and affect. His behavior is normal.    ED Course  Procedures (including critical care time)  Date: 02/06/2011  Rate: 79   Rhythm: normal sinus rhythm and premature ventricular contractions (PVC)  QRS Axis: normal  Intervals: normal  ST/T Wave abnormalities: normal  Conduction Disutrbances:nonspecific intraventricular conduction delay  Narrative Interpretation:   Old EKG Reviewed: unchanged   Labs Reviewed  CBC - Abnormal; Notable for the following:    Platelets 128 (*)    All other components within normal limits  BASIC METABOLIC PANEL - Abnormal; Notable for  the following:    Glucose, Bld 115 (*)    BUN 26 (*)    Creatinine, Ser 1.47 (*)    GFR calc non Af Amer 44 (*)    GFR calc Af Amer 51 (*)    All other components within normal limits  URINALYSIS, ROUTINE W REFLEX MICROSCOPIC - Abnormal; Notable for the following:    Ketones, ur TRACE (*)    Protein, ur 30 (*)    All other components within normal limits  URINE MICROSCOPIC-ADD ON - Abnormal; Notable for the following:    Bacteria, UA FEW (*)    Casts HYALINE CASTS (*) WBC CAST   All other components within normal limits  TROPONIN I  LACTIC ACID, PLASMA  CULTURE, BLOOD (ROUTINE X 2)  CULTURE, BLOOD (ROUTINE X 2)  URINE CULTURE   Dg Chest 2  View  02/06/2011  *RADIOLOGY REPORT*  Clinical Data: Altered mental status.  CHEST - 2 VIEW  Comparison: 25 12  Findings: Two views of the chest were obtained.  The patient has a coronary artery stents.  Prominent lung markings that suggest chronic changes.  There is no focal airspace disease.  The trachea is midline. Status post median sternotomy and cardiac valve surgery.  IMPRESSION: No acute chest findings.  Original Report Authenticated By: Richarda Overlie, M.D.     1. Altered mental status       MDM  Pt sent from memory care unit at [facility] for AMS.  On exam, pt mumbling, does not make eye contact, intermittent tremors and mild agitation.  Rectal temp 100.1 but VS otherwise w/in nml limits.  AMS likely secondary to infection.  EKG non-ischemic. Labs unremarkable w/ exception of renal insufficiency.  U/A and CXR pending.  Tylenol suppository ordered.  Pt stable.  Will recheck temperature.  CXR neg for pneumonia and U/A pending.  His wife is present and reports that he has had multiple UTIs in the past, one of which was associated w/ psychosis.   U/A shows few bacteria but otherwise nml.  Culture ordered.  Lactate and blood cultures ordered as well.  [facility] consulted for admission.        Otilio Miu, [place] 02/06/11 [phone]

## 2011-02-06 NOTE — ED Notes (Signed)
WUJ:[id number]<JY> Expected date:02/06/11<BR> Expected time:10:36 AM<BR> Means of arrival:<BR> Comments:<BR> AMS   EMS

## 2011-02-07 LAB — CBC
HCT: 35.7 % — ABNORMAL LOW (ref 39.0–52.0)
Hemoglobin: 11.6 g/dL — ABNORMAL LOW (ref 13.0–17.0)
MCH: 28.9 pg (ref 26.0–34.0)
MCHC: 32.5 g/dL (ref 30.0–36.0)
RBC: 4.02 MIL/uL — ABNORMAL LOW (ref 4.22–5.81)

## 2011-02-07 LAB — URINE CULTURE
Colony Count: NO GROWTH
Culture  Setup Time: 201212021805
Culture: NO GROWTH

## 2011-02-07 LAB — FOLATE: Folate: >20 ng/mL

## 2011-02-07 LAB — MRSA PCR SCREENING: MRSA by PCR: POSITIVE — AB

## 2011-02-07 LAB — BASIC METABOLIC PANEL
BUN: 29 mg/dL — ABNORMAL HIGH (ref 6–23)
Chloride: 110 mEq/L (ref 96–112)
Glucose, Bld: 95 mg/dL (ref 70–99)
Potassium: 4.1 mEq/L (ref 3.5–5.1)

## 2011-02-07 LAB — INFLUENZA PANEL BY PCR (TYPE A & B)
H1N1 flu by pcr: NOT DETECTED
Influenza A By PCR: NEGATIVE

## 2011-02-07 LAB — VITAMIN B12: Vitamin B-12: 187 pg/mL — ABNORMAL LOW (ref 211–911)

## 2011-02-07 MED ORDER — MUPIROCIN 2 % EX OINT
1.0000 "application " | TOPICAL_OINTMENT | Freq: Two times a day (BID) | CUTANEOUS | Status: DC
  Administered 2011-02-07 – 2011-02-10 (×5): 1 via NASAL
  Filled 2011-02-07: qty 22

## 2011-02-07 MED ORDER — DEXTROSE 5 % IV SOLN
1.0000 g | INTRAVENOUS | Status: DC
  Administered 2011-02-07 – 2011-02-09 (×3): 1 g via INTRAVENOUS
  Filled 2011-02-07 (×4): qty 10

## 2011-02-07 MED ORDER — DEXTROSE-NACL 5-0.9 % IV SOLN
INTRAVENOUS | Status: DC
  Administered 2011-02-07 – 2011-02-10 (×4): via INTRAVENOUS

## 2011-02-07 MED ORDER — CHLORHEXIDINE GLUCONATE CLOTH 2 % EX PADS
6.0000 | MEDICATED_PAD | Freq: Every day | CUTANEOUS | Status: DC
  Administered 2011-02-08 – 2011-02-09 (×2): 6 via TOPICAL

## 2011-02-07 NOTE — ED Provider Notes (Signed)
Medical screening examination/treatment/procedure(s) were conducted as a shared visit with non-physician practitioner(s) and myself.  I personally evaluated the patient during the encounter  Nicholes Stairs, MD 02/07/11 1112

## 2011-02-07 NOTE — Progress Notes (Signed)
PT. IS POSITIVE FOR MRSA, DR. Chaney Malling NOTIFIED,  MRSA ORDERS HAVE BEEN ACTIVATED.

## 2011-02-07 NOTE — Progress Notes (Signed)
Speech Language/Pathology Clinical/Bedside Swallow Evaluation Patient Details  Name: [name] MRN: [id number] DOB: 12-Mar-1929 Today's Date: 02/07/2011 [id number]  Past Medical History:  Past Medical History  Diagnosis Date  . GERD (gastroesophageal reflux disease)   . Alzheimer's dementia   . Dementia   . Coronary artery disease   . Hyperlipidemia   . BPH (benign prostatic hyperplasia)   . Thrombocytopenia   . History of aortic stenosis     Status post AVR with bioprosthetic valve   Past Surgical History:  Past Surgical History  Procedure Date  . Coronary artery bypass graft January 20, 1999    Assessment/Recommendations/Treatment Plan Suspected Esophageal Findings Suspected Esophageal Findings: Other (comment) (known history of small hiatal hernia, reflux, prom cp 3-02)  SLP Assessment Clinical Impression Statement: Pt presents with dysphagia that is consistent with diagnosis of dementia, with acute worsening secondary to medical conditiion.   Spouse reports she fed pt on Saturday evening PTA, as he was unable to feed himself.  Oral holding with liquids reported by pt's spouse, but adequate tolerance of solids.  Per RN documentation , pt's son acknowledged excessive mastication with solids.  Aspiration risk is high if pt fed when lethargic, pt does not appear appropriate for solids requriing mastication d/t gross weakness.  Rec full liquid diet advancing as appropriate during medical stay.  Order received.  Pt's spouse educated to aspiration precautions and importance of airway protection by  NOT feeding pt if he is lethargic.   Risk for Aspiration: Severe Other Related Risk Factors: History of dysphagia;History of GERD;Lethargy;Decreased management of secretions;Cognitive impairment;Decreased respiratory status  Recommendations Solid Consistency: No solids, see liquids Liquid Consistency: Thin (full liquid) Liquid Administration via: Straw Medication Administration:  Crushed with puree Supervision: Staff feed patient Compensations: Slow rate;Small sips/bites (check for oral residuals) Oral Care Recommendations: Oral care QID  Prognosis Prognosis for Safe Diet Advancement: Guarded Barriers to Reach Goals: Cognitive deficits;Severity of dysphagia;Behavior Barriers/Prognosis Comment: spouse  Individuals Consulted Consulted and Agree with Results and Recommendations: Patient;Family member/caregiver  Swallow Study Goals  SLP Swallowing Goals Patient will utilize recommended strategies during swallow to increase swallowing safety with: Total assistance  Swallow Study Prior Functional Status  Pt resides at [facility] memory unit, per spouse has had a downturn in the last few months.  Pt slumped down in chair over the weekend at the memory unit, admitted with fevers, AMS.  CXR negative, ? UTI.  Pt with h/o dementia, hyperlipidemia, BPH, aortic stenosis.    General  Date of Onset: 02/07/11 Type of Study: Bedside swallow evaluation Diet Prior to this Study: NPO;Other (Comment) (chop meats, finger foods, one utencil on tray, full assist) Respiratory Status: Supplemental O2 delivered via (comment) Behavior/Cognition: Lethargic;Impulsive;Doesn't follow directions Oral Cavity - Dentition: Adequate natural dentition;Dentures, bottom;Dentures, top (top required adhesive, not in place) Vision: Impaired for self-feeding (pt unable to self feed today, stiff) Patient Positioning: Postural control adequate for testing Baseline Vocal Quality: Low vocal intensity;Hoarse Volitional Cough: Cognitively unable to elicit Volitional Swallow: Unable to elicit Ice chips: Tested (comment)  Oral Motor/Sensory Function  Labial ROM: Reduced left Labial Symmetry: Abnormal symmetry left Labial Strength: Reduced Labial Sensation: Reduced Lingual ROM: Reduced right;Reduced left Lingual Strength: Reduced Lingual Sensation: Reduced (delayed swallow) Facial ROM: Reduced  left Facial Symmetry: Left droop (? mild left labial, facial) Facial Strength: Other (Comment) (unable to test) Velum:  (unable to test) Mandible: Other (Comment) (unable to test)  Consistency Results  Ice Chips Ice chips: Impaired Oral Phase Impairments: Reduced labial  seal;Reduced lingual movement/coordination;Poor awareness of bolus Oral Phase Functional Implications: Oral holding Pharyngeal Phase Impairments: Delayed Swallow  Thin Liquid Thin Liquid: Impaired Presentation: Straw;Spoon;Cup Oral Phase Impairments: Impaired anterior to posterior transit;Poor awareness of bolus;Reduced lingual movement/coordination;Reduced labial seal Oral Phase Functional Implications: Right anterior spillage;Left anterior spillage;Oral holding Pharyngeal  Phase Impairments: Delayed Swallow  Nectar Thick Liquid Nectar Thick Liquid: Not tested  Honey Thick Liquid Honey Thick Liquid: Not tested  Puree Puree: Impaired Presentation: Spoon Oral Phase Impairments: Reduced labial seal;Impaired anterior to posterior transit Other Comments: icecream, fed by SLP  Solid Solid: Impaired Oral Phase Impairments: Reduced labial seal;Reduced lingual movement/coordination;Impaired anterior to posterior transit;Poor awareness of bolus Oral Phase Functional Implications: Oral residue;Oral holding;Other (comment) (right frontal holding, vertical mastication pattern) Pharyngeal Phase Impairments: Delayed Swallow Other Comments: cheese small bolus, poor awareness to bolus   Chales Abrahams 02/07/2011,3:55 PM

## 2011-02-07 NOTE — Progress Notes (Signed)
Patient ID: [name], male   DOB: April 16, 1929, 77 y.o.   MRN: [id number] Subjective: Patient seen.Not able to verbalise complaints  Objective: Weight change:   Intake/Output Summary (Last 24 hours) at 02/07/11 1442 Last data filed at 02/07/11 0711  Gross per 24 hour  Intake    600 ml  Output      0 ml  Net    600 ml   BP 127/56  Pulse 74  Temp(Src) 98.1 F (36.7 C) (Oral)  Resp 18  Ht 5\' 8"  (1.727 m)  Wt 58.3 kg (128 lb 8.5 oz)  BMI 19.54 kg/m2  SpO2 99% Physical Exam: General appearance: alert,not cooperative and no distress,dehydrated.confused Head: Normocephalic, without obvious abnormality, atraumatic Neck: no adenopathy, no carotid bruit, no JVD, supple, symmetrical, trachea midline and thyroid not enlarged, symmetric, no tenderness/mass/nodules Lungs: clear to auscultation bilaterally Heart: regular rate and rhythm, S1, S2 normal, no murmur, click, rub or gallop Abdomen: soft, non-tender; bowel sounds normal; no masses,  no organomegaly Extremities: extremities normal, atraumatic, no cyanosis or edema Skin: Dry with multiple superficial skin lesions Neuro-no lateralising signs.moves all extremities Neuro-psch-not oriented to person,place or time   Lab Results: Results for orders placed during the hospital encounter of 02/06/11 (from the past 48 hour(s))  CBC     Status: Abnormal   Collection Time   02/06/11 11:00 AM      Component Value Range Comment   WBC 8.2  4.0 - 10.5 (K/uL)    RBC 4.44  4.22 - 5.81 (MIL/uL)    Hemoglobin 13.1  13.0 - 17.0 (g/dL)    HCT 40.9  81.1 - 91.4 (%)    MCV 90.5  78.0 - 100.0 (fL)    MCH 29.5  26.0 - 34.0 (pg)    MCHC 32.6  30.0 - 36.0 (g/dL)    RDW 78.2  95.6 - 21.3 (%)    Platelets 128 (*) 150 - 400 (K/uL)   BASIC METABOLIC PANEL     Status: Abnormal   Collection Time   02/06/11 11:00 AM      Component Value Range Comment   Sodium 141  135 - 145 (mEq/L)    Potassium 4.8  3.5 - 5.1 (mEq/L)    Chloride 105  96 - 112 (mEq/L)      CO2 29  19 - 32 (mEq/L)    Glucose, Bld 115 (*) 70 - 99 (mg/dL)    BUN 26 (*) 6 - 23 (mg/dL)    Creatinine, Ser 0.86 (*) 0.50 - 1.35 (mg/dL)    Calcium 9.0  8.4 - 10.5 (mg/dL)    GFR calc non Af Amer 44 (*) >90 (mL/min)    GFR calc Af Amer 51 (*) >90 (mL/min)   TROPONIN I     Status: Normal   Collection Time   02/06/11 11:00 AM      Component Value Range Comment   Troponin I <0.30  <0.30 (ng/mL)   URINALYSIS, ROUTINE W REFLEX MICROSCOPIC     Status: Abnormal   Collection Time   02/06/11 12:21 PM      Component Value Range Comment   Color, Urine YELLOW  YELLOW     APPearance CLEAR  CLEAR     Specific Gravity, Urine 1.026  1.005 - 1.030     pH 6.0  5.0 - 8.0     Glucose, UA NEGATIVE  NEGATIVE (mg/dL)    Hgb urine dipstick NEGATIVE  NEGATIVE     Bilirubin Urine NEGATIVE  NEGATIVE  Ketones, ur TRACE (*) NEGATIVE (mg/dL)    Protein, ur 30 (*) NEGATIVE (mg/dL)    Urobilinogen, UA 1.0  0.0 - 1.0 (mg/dL)    Nitrite NEGATIVE  NEGATIVE     Leukocytes, UA NEGATIVE  NEGATIVE    URINE MICROSCOPIC-ADD ON     Status: Abnormal   Collection Time   02/06/11 12:21 PM      Component Value Range Comment   Squamous Epithelial / LPF RARE  RARE     WBC, UA 3-6  <3 (WBC/hpf) WITH CLUMPS   Bacteria, UA FEW (*) RARE     Casts HYALINE CASTS (*) NEGATIVE  WBC CAST   Urine-Other MUCOUS PRESENT     CULTURE, BLOOD (ROUTINE X 2)     Status: Normal (Preliminary result)   Collection Time   02/06/11  2:00 PM      Component Value Range Comment   Specimen Description BLOOD RIGHT UPPER ARM  1 ML IN AEROBIC ONLY      Special Requests NONE      Setup Time [id number]      Culture        Value:        BLOOD CULTURE RECEIVED NO GROWTH TO DATE CULTURE WILL BE HELD FOR 5 DAYS BEFORE ISSUING A FINAL NEGATIVE REPORT   Report Status PENDING     LACTIC ACID, PLASMA     Status: Normal   Collection Time   02/06/11  2:15 PM      Component Value Range Comment   Lactic Acid, Venous 1.1  0.5 - 2.2 (mmol/L)   CULTURE,  BLOOD (ROUTINE X 2)     Status: Normal (Preliminary result)   Collection Time   02/06/11  2:15 PM      Component Value Range Comment   Specimen Description BLOOD RIGHT HAND  1 ML IN AEROBIC ONLY      Special Requests NONE      Setup Time [id number]      Culture        Value:        BLOOD CULTURE RECEIVED NO GROWTH TO DATE CULTURE WILL BE HELD FOR 5 DAYS BEFORE ISSUING A FINAL NEGATIVE REPORT   Report Status PENDING     URINE CULTURE     Status: Normal   Collection Time   02/06/11  2:46 PM      Component Value Range Comment   Specimen Description URINE, CATHETERIZED      Special Requests NONE      Setup Time [id number]      Colony Count NO GROWTH      Culture NO GROWTH      Report Status 02/07/2011 FINAL     TSH     Status: Normal   Collection Time   02/06/11 10:30 PM      Component Value Range Comment   TSH 2.567  0.350 - 4.500 (uIU/mL)   FOLATE     Status: Normal   Collection Time   02/06/11 10:30 PM      Component Value Range Comment   Folate >20.0     VITAMIN B12     Status: Abnormal   Collection Time   02/06/11 10:30 PM      Component Value Range Comment   Vitamin B-12 187 (*) 211 - 911 (pg/mL)   PROCALCITONIN     Status: Normal   Collection Time   02/06/11 10:30 PM      Component Value Range Comment   Procalcitonin <  0.10     INFLUENZA PANEL BY PCR     Status: Normal   Collection Time   02/07/11  3:38 AM      Component Value Range Comment   Influenza A By PCR NEGATIVE  NEGATIVE     Influenza B By PCR NEGATIVE  NEGATIVE     H1N1 flu by pcr NOT DETECTED  NOT DETECTED    BASIC METABOLIC PANEL     Status: Abnormal   Collection Time   02/07/11  6:05 AM      Component Value Range Comment   Sodium 141  135 - 145 (mEq/L)    Potassium 4.1  3.5 - 5.1 (mEq/L)    Chloride 110  96 - 112 (mEq/L)    CO2 23  19 - 32 (mEq/L)    Glucose, Bld 95  70 - 99 (mg/dL)    BUN 29 (*) 6 - 23 (mg/dL)    Creatinine, Ser 1.61  0.50 - 1.35 (mg/dL)    Calcium 8.2 (*) 8.4 - 10.5 (mg/dL)     GFR calc non Af Amer 55 (*) >90 (mL/min)    GFR calc Af Amer 63 (*) >90 (mL/min)   CBC     Status: Abnormal   Collection Time   02/07/11  6:05 AM      Component Value Range Comment   WBC 3.5 (*) 4.0 - 10.5 (K/uL)    RBC 4.02 (*) 4.22 - 5.81 (MIL/uL)    Hemoglobin 11.6 (*) 13.0 - 17.0 (g/dL)    HCT 09.6 (*) 04.5 - 52.0 (%)    MCV 88.8  78.0 - 100.0 (fL)    MCH 28.9  26.0 - 34.0 (pg)    MCHC 32.5  30.0 - 36.0 (g/dL)    RDW 40.9  81.1 - 91.4 (%)    Platelets 100 (*) 150 - 400 (K/uL)   MRSA PCR SCREENING     Status: Abnormal   Collection Time   02/07/11 11:32 AM      Component Value Range Comment   MRSA by PCR POSITIVE (*) NEGATIVE      Micro Results: Recent Results (from the past 240 hour(s))  CULTURE, BLOOD (ROUTINE X 2)     Status: Normal (Preliminary result)   Collection Time   02/06/11  2:00 PM      Component Value Range Status Comment   Specimen Description BLOOD RIGHT UPPER ARM  1 ML IN AEROBIC ONLY   Final    Special Requests NONE   Final    Setup Time [id number]   Final    Culture     Final    Value:        BLOOD CULTURE RECEIVED NO GROWTH TO DATE CULTURE WILL BE HELD FOR 5 DAYS BEFORE ISSUING A FINAL NEGATIVE REPORT   Report Status PENDING   Incomplete   CULTURE, BLOOD (ROUTINE X 2)     Status: Normal (Preliminary result)   Collection Time   02/06/11  2:15 PM      Component Value Range Status Comment   Specimen Description BLOOD RIGHT HAND  1 ML IN AEROBIC ONLY   Final    Special Requests NONE   Final    Setup Time [id number]   Final    Culture     Final    Value:        BLOOD CULTURE RECEIVED NO GROWTH TO DATE CULTURE WILL BE HELD FOR 5 DAYS BEFORE ISSUING A FINAL NEGATIVE REPORT   Report  Status PENDING   Incomplete   URINE CULTURE     Status: Normal   Collection Time   02/06/11  2:46 PM      Component Value Range Status Comment   Specimen Description URINE, CATHETERIZED   Final    Special Requests NONE   Final    Setup Time [id number]   Final    Colony  Count NO GROWTH   Final    Culture NO GROWTH   Final    Report Status 02/07/2011 FINAL   Final   MRSA PCR SCREENING     Status: Abnormal   Collection Time   02/07/11 11:32 AM      Component Value Range Status Comment   MRSA by PCR POSITIVE (*) NEGATIVE  Final     Studies/Results: Dg Chest 2 View  02/06/2011  *RADIOLOGY REPORT*  Clinical Data: Altered mental status.  CHEST - 2 VIEW  Comparison: 25 12  Findings: Two views of the chest were obtained.  The patient has a coronary artery stents.  Prominent lung markings that suggest chronic changes.  There is no focal airspace disease.  The trachea is midline. Status post median sternotomy and cardiac valve surgery.  IMPRESSION: No acute chest findings.  Original Report Authenticated By: Richarda Overlie, M.D.   Medications: Scheduled Meds:   . albuterol  2.5 mg Nebulization Q6H  . aspirin  81 mg Oral QHS  . cefTRIAXone (ROCEPHIN)  IV  1 g Intravenous Q24H  . cholecalciferol  400 Units Oral Daily  . docusate sodium  100 mg Oral BID  . donepezil  23 mg Oral QHS  . enoxaparin  30 mg Subcutaneous Q24H  . folic acid  1 mg Oral Q24H  . ipratropium  0.5 mg Nebulization Q6H  . memantine  10 mg Oral BID  . polyethylene glycol  17 g Oral Daily  . QUEtiapine  100 mg Oral QHS   Continuous Infusions:   . dextrose 5 % and 0.9% NaCl    . DISCONTD: sodium chloride 75 mL/hr at 02/07/11 1128   PRN Meds:.LORazepam, morphine, nitroGLYCERIN  Assessment/Plan: #1 Altered mental status-most likely secondary to deteriorating dementia or infection #2 Alzheimer's dementia #3 BPH (benign prostatic hyperplasia) #4 Fever-will treat emipricaly with iv rocephin #5 Chronic kidney disease, stage III-will rehydrate gently Will order swallow evaluation   LOS: 1 day   Larcenia Holaday 02/07/2011, 2:42 PM

## 2011-02-07 NOTE — Progress Notes (Signed)
ABLE TO GET URINE SAMPLE, NO NEED TO DO THE IN & OUT CATH AT THIS TIME.

## 2011-02-07 NOTE — Progress Notes (Signed)
INITIAL ADULT NUTRITION ASSESSMENT Date: 02/07/2011   Time: 2:32 PM Reason for Assessment: consult  ASSESSMENT: Male 77 y.o.  Dx: Altered mental status  Hx:  Past Medical History  Diagnosis Date  . GERD (gastroesophageal reflux disease)   . Alzheimer's dementia   . Dementia   . Coronary artery disease   . Hyperlipidemia   . BPH (benign prostatic hyperplasia)   . Thrombocytopenia   . History of aortic stenosis     Status post AVR with bioprosthetic valve   Past Surgical History  Procedure Date  . Coronary artery bypass graft January 20, 1999    Related Meds:  Scheduled Meds:   . albuterol  2.5 mg Nebulization Q6H  . aspirin  81 mg Oral QHS  . cholecalciferol  400 Units Oral Daily  . docusate sodium  100 mg Oral BID  . donepezil  23 mg Oral QHS  . enoxaparin  30 mg Subcutaneous Q24H  . folic acid  1 mg Oral Q24H  . ipratropium  0.5 mg Nebulization Q6H  . memantine  10 mg Oral BID  . polyethylene glycol  17 g Oral Daily  . QUEtiapine  100 mg Oral QHS   Continuous Infusions:   . sodium chloride 75 mL/hr at 02/07/11 1128   PRN Meds:.LORazepam, morphine, nitroGLYCERIN   Ht: 5\' 8"  (172.7 cm)  Wt: 128 lb 8.5 oz (58.3 kg)  Ideal Wt: 77.5 kg % Ideal Wt: 75%  Usual Wt: unknown % Usual Wt:   Body mass index is 19.54 kg/(m^2).  Food/Nutrition Related Hx:   Pt with baseline dementia admitted with AMS found to have pneumonia. Currently NPO awaiting swallow evaluation.  Pt pocketed foods and excessively chewing per son.  At baseline, pt consumes chopped meat, finger foods with assistance.  Labs:  CMP     Component Value Date/Time   NA 141 02/07/2011 0605   K 4.1 02/07/2011 0605   CL 110 02/07/2011 0605   CO2 23 02/07/2011 0605   GLUCOSE 95 02/07/2011 0605   BUN 29* 02/07/2011 0605   CREATININE 1.22 02/07/2011 0605   CALCIUM 8.2* 02/07/2011 0605   PROT 6.5 08/30/2010 1948   ALBUMIN 3.9 08/30/2010 1948   AST 17 08/30/2010 1948   ALT 10 08/30/2010 1948   ALKPHOS 70  08/30/2010 1948   BILITOT 0.5 08/30/2010 1948   GFRNONAA 55* 02/07/2011 0605   GFRAA 63* 02/07/2011 0605    CBC    Component Value Date/Time   WBC 3.5* 02/07/2011 0605   RBC 4.02* 02/07/2011 0605   HGB 11.6* 02/07/2011 0605   HCT 35.7* 02/07/2011 0605   PLT 100* 02/07/2011 0605   MCV 88.8 02/07/2011 0605   MCH 28.9 02/07/2011 0605   MCHC 32.5 02/07/2011 0605   RDW 14.6 02/07/2011 0605   LYMPHSABS 1.0 06/18/2009 1617   MONOABS 0.3 06/18/2009 1617   EOSABS 0.2 06/18/2009 1617   BASOSABS 0.0 06/18/2009 1617    Intake: NPO Output:   Intake/Output Summary (Last 24 hours) at 02/07/11 1437 Last data filed at 02/07/11 0711  Gross per 24 hour  Intake    600 ml  Output      0 ml  Net    600 ml     Total I/O In: 600 [I.V.:600] Out: -  1 BM since admission.  Diet Order: NPO  Supplements/Tube Feeding: none at this time.  IVF:    sodium chloride Last Rate: 75 mL/hr at 02/07/11 1128    Estimated Nutritional Needs:  Kcal: 1740-2030 kcal Protein: 81-92 g Fluid: >2.0 L/day  NUTRITION DIAGNOSIS: -Inadequate oral intake (NI-2.1).  Status: Ongoing  RELATED TO: omission of energy dense foods  AS EVIDENCE BY: pt NPO, await SLP eval  MONITORING/EVALUATION(Goals): 1.  Food/Beverage; pt tolerating PO diet if appropriate per SLP.  2.  Enteral nutrition; if PO diet not appropriate, may need to discuss with family.  Pt is thin, underweight, BMI suboptimal for age. Pt not likely meeting needs with PO intake alone.  EDUCATION NEEDS: -No education needs identified at this time  INTERVENTION: 1.  Modify diet; per SLP/MD discretion.  RD to follow to assess adequacy. 2.  Enteral nutrition; pt with advanced dementia, typically eats PO.  Consult if recommendations warranted otherwise continue current management unless contraindicated by SLP.  Dietitian [phone]  DOCUMENTATION CODES Per approved criteria  -Underweight    Loyce Dys Sue-Ellen 02/07/2011, 2:32 PM

## 2011-02-08 LAB — COMPREHENSIVE METABOLIC PANEL
ALT: 8 U/L (ref 0–53)
Alkaline Phosphatase: 75 U/L (ref 39–117)
BUN: 17 mg/dL (ref 6–23)
CO2: 26 mEq/L (ref 19–32)
Chloride: 109 mEq/L (ref 96–112)
GFR calc Af Amer: 71 mL/min — ABNORMAL LOW (ref >90)
GFR calc non Af Amer: 61 mL/min — ABNORMAL LOW (ref >90)
Glucose, Bld: 103 mg/dL — ABNORMAL HIGH (ref 70–99)
Potassium: 3.6 mEq/L (ref 3.5–5.1)
Sodium: 140 mEq/L (ref 135–145)
Total Bilirubin: 0.3 mg/dL (ref 0.3–1.2)

## 2011-02-08 LAB — URINE CULTURE: Culture: NO GROWTH

## 2011-02-08 LAB — CBC
HCT: 35.1 % — ABNORMAL LOW (ref 39.0–52.0)
Hemoglobin: 11.5 g/dL — ABNORMAL LOW (ref 13.0–17.0)
MCHC: 32.8 g/dL (ref 30.0–36.0)
RBC: 3.86 MIL/uL — ABNORMAL LOW (ref 4.22–5.81)
WBC: 4.4 10*3/uL (ref 4.0–10.5)

## 2011-02-08 MED ORDER — IPRATROPIUM BROMIDE 0.02 % IN SOLN
0.5000 mg | Freq: Three times a day (TID) | RESPIRATORY_TRACT | Status: DC
  Administered 2011-02-08 – 2011-02-10 (×7): 0.5 mg via RESPIRATORY_TRACT
  Filled 2011-02-08 (×8): qty 2.5

## 2011-02-08 MED ORDER — ENOXAPARIN SODIUM 40 MG/0.4ML ~~LOC~~ SOLN
40.0000 mg | Freq: Every day | SUBCUTANEOUS | Status: DC
  Administered 2011-02-08 – 2011-02-10 (×2): 40 mg via SUBCUTANEOUS
  Filled 2011-02-08 (×3): qty 0.4

## 2011-02-08 MED ORDER — ALBUTEROL SULFATE (5 MG/ML) 0.5% IN NEBU
2.5000 mg | INHALATION_SOLUTION | Freq: Three times a day (TID) | RESPIRATORY_TRACT | Status: DC
  Administered 2011-02-08 – 2011-02-10 (×7): 2.5 mg via RESPIRATORY_TRACT
  Filled 2011-02-08 (×5): qty 0.5

## 2011-02-08 MED ORDER — ALBUTEROL SULFATE (5 MG/ML) 0.5% IN NEBU
2.5000 mg | INHALATION_SOLUTION | Freq: Four times a day (QID) | RESPIRATORY_TRACT | Status: DC | PRN
  Filled 2011-02-08 (×3): qty 0.5

## 2011-02-08 NOTE — Progress Notes (Signed)
Speech Language/Pathology [phone]  Pt asleep as SLP entered room, wife present and re-educated to aspiration precautions requiring encouragement to assure pt fed only when ALERT.  Pt's spouse reports he has been excessively lethargic today with poor intake as a result.  Pt consuming only a few bites/sips and then requested to be finished per spouse.  Pt woke for urination while SLP in room and accepted a few sips of water.  Swallow appeared more timely today.  Spouse asked regarding use of thickener, advised her to conditions when thickener may be indicated including recurrent pulmonary infections, frequent coughing with thin but not with thicker liquids for comfort, etc, but do not recommend thickened liquids currently.   Per RN, pt tolerating po intake when alert.   Pt is afebrile with clear lung sounds.  SLP to follow for diet tolerance and advancement as indicated.    Pt is progressing toward goals, recommend continue SLP treatment.  Thank you.  Jose Burnet, MS [facility] SLP [phone]

## 2011-02-08 NOTE — Progress Notes (Signed)
Patient ID: [name], male   DOB: 11/05/1929, 77 y.o.   MRN: [id number] Patient ID: [name], male   DOB: 1930-01-09, 77 y.o.   MRN: [id number] Subjective: Patient seen.confused and very sleepy.Unable to verbalise complaints  Objective: Weight change:   Intake/Output Summary (Last 24 hours) at 02/08/11 1235 Last data filed at 02/08/11 0900  Gross per 24 hour  Intake   2390 ml  Output      0 ml  Net   2390 ml   BP 148/81  Pulse 73  Temp(Src) 97.4 F (36.3 C) (Axillary)  Resp 18  Ht 5\' 8"  (1.727 m)  Wt 58.3 kg (128 lb 8.5 oz)  BMI 19.54 kg/m2  SpO2 92% Physical Exam: General appearance: sleepy,not cooperative and no distress,dehydrated.confused Head: Normocephalic, without obvious abnormality, atraumatic Neck: no adenopathy, no carotid bruit, no JVD, supple, symmetrical, trachea midline and thyroid not enlarged, symmetric, no tenderness/mass/nodules Lungs: clear to auscultation bilaterally Heart: regular rate and rhythm, S1, S2 normal, no murmur, click, rub or gallop Abdomen: soft, non-tender; bowel sounds normal; no masses,  no organomegaly Extremities: extremities normal, atraumatic, no cyanosis or edema Skin: Dry with multiple superficial skin lesions Neuro-no lateralising signs.moves all extremities Neuro-psch-not oriented to person,place or time   Lab Results: Results for orders placed during the hospital encounter of 02/06/11 (from the past 48 hour(s))  CBC     Status: Abnormal   Collection Time   02/06/11 11:00 AM      Component Value Range Comment   WBC 8.2  4.0 - 10.5 (K/uL)    RBC 4.44  4.22 - 5.81 (MIL/uL)    Hemoglobin 13.1  13.0 - 17.0 (g/dL)    HCT 78.2  95.6 - 21.3 (%)    MCV 90.5  78.0 - 100.0 (fL)    MCH 29.5  26.0 - 34.0 (pg)    MCHC 32.6  30.0 - 36.0 (g/dL)    RDW 08.6  57.8 - 46.9 (%)    Platelets 128 (*) 150 - 400 (K/uL)   BASIC METABOLIC PANEL     Status: Abnormal   Collection Time   02/06/11 11:00 AM      Component Value Range Comment   Sodium 141  135 - 145 (mEq/L)    Potassium 4.8  3.5 - 5.1 (mEq/L)    Chloride 105  96 - 112 (mEq/L)    CO2 29  19 - 32 (mEq/L)    Glucose, Bld 115 (*) 70 - 99 (mg/dL)    BUN 26 (*) 6 - 23 (mg/dL)    Creatinine, Ser 6.29 (*) 0.50 - 1.35 (mg/dL)    Calcium 9.0  8.4 - 10.5 (mg/dL)    GFR calc non Af Amer 44 (*) >90 (mL/min)    GFR calc Af Amer 51 (*) >90 (mL/min)   TROPONIN I     Status: Normal   Collection Time   02/06/11 11:00 AM      Component Value Range Comment   Troponin I <0.30  <0.30 (ng/mL)   URINALYSIS, ROUTINE W REFLEX MICROSCOPIC     Status: Abnormal   Collection Time   02/06/11 12:21 PM      Component Value Range Comment   Color, Urine YELLOW  YELLOW     APPearance CLEAR  CLEAR     Specific Gravity, Urine 1.026  1.005 - 1.030     pH 6.0  5.0 - 8.0     Glucose, UA NEGATIVE  NEGATIVE (mg/dL)    Hgb  urine dipstick NEGATIVE  NEGATIVE     Bilirubin Urine NEGATIVE  NEGATIVE     Ketones, ur TRACE (*) NEGATIVE (mg/dL)    Protein, ur 30 (*) NEGATIVE (mg/dL)    Urobilinogen, UA 1.0  0.0 - 1.0 (mg/dL)    Nitrite NEGATIVE  NEGATIVE     Leukocytes, UA NEGATIVE  NEGATIVE    URINE MICROSCOPIC-ADD ON     Status: Abnormal   Collection Time   02/06/11 12:21 PM      Component Value Range Comment   Squamous Epithelial / LPF RARE  RARE     WBC, UA 3-6  <3 (WBC/hpf) WITH CLUMPS   Bacteria, UA FEW (*) RARE     Casts HYALINE CASTS (*) NEGATIVE  WBC CAST   Urine-Other MUCOUS PRESENT     CULTURE, BLOOD (ROUTINE X 2)     Status: Normal (Preliminary result)   Collection Time   02/06/11  2:00 PM      Component Value Range Comment   Specimen Description BLOOD RIGHT UPPER ARM  1 ML IN AEROBIC ONLY      Special Requests NONE      Setup Time [id number]      Culture        Value:        BLOOD CULTURE RECEIVED NO GROWTH TO DATE CULTURE WILL BE HELD FOR 5 DAYS BEFORE ISSUING A FINAL NEGATIVE REPORT   Report Status PENDING     LACTIC ACID, PLASMA     Status: Normal   Collection Time   02/06/11   2:15 PM      Component Value Range Comment   Lactic Acid, Venous 1.1  0.5 - 2.2 (mmol/L)   CULTURE, BLOOD (ROUTINE X 2)     Status: Normal (Preliminary result)   Collection Time   02/06/11  2:15 PM      Component Value Range Comment   Specimen Description BLOOD RIGHT HAND  1 ML IN AEROBIC ONLY      Special Requests NONE      Setup Time [id number]      Culture        Value:        BLOOD CULTURE RECEIVED NO GROWTH TO DATE CULTURE WILL BE HELD FOR 5 DAYS BEFORE ISSUING A FINAL NEGATIVE REPORT   Report Status PENDING     URINE CULTURE     Status: Normal   Collection Time   02/06/11  2:46 PM      Component Value Range Comment   Specimen Description URINE, CATHETERIZED      Special Requests NONE      Setup Time [id number]      Colony Count NO GROWTH      Culture NO GROWTH      Report Status 02/07/2011 FINAL     TSH     Status: Normal   Collection Time   02/06/11 10:30 PM      Component Value Range Comment   TSH 2.567  0.350 - 4.500 (uIU/mL)   FOLATE     Status: Normal   Collection Time   02/06/11 10:30 PM      Component Value Range Comment   Folate >20.0     VITAMIN B12     Status: Abnormal   Collection Time   02/06/11 10:30 PM      Component Value Range Comment   Vitamin B-12 187 (*) 211 - 911 (pg/mL)   PROCALCITONIN     Status: Normal   Collection  Time   02/06/11 10:30 PM      Component Value Range Comment   Procalcitonin <0.10     INFLUENZA PANEL BY PCR     Status: Normal   Collection Time   02/07/11  3:38 AM      Component Value Range Comment   Influenza A By PCR NEGATIVE  NEGATIVE     Influenza B By PCR NEGATIVE  NEGATIVE     H1N1 flu by pcr NOT DETECTED  NOT DETECTED    BASIC METABOLIC PANEL     Status: Abnormal   Collection Time   02/07/11  6:05 AM      Component Value Range Comment   Sodium 141  135 - 145 (mEq/L)    Potassium 4.1  3.5 - 5.1 (mEq/L)    Chloride 110  96 - 112 (mEq/L)    CO2 23  19 - 32 (mEq/L)    Glucose, Bld 95  70 - 99 (mg/dL)    BUN 29 (*) 6  - 23 (mg/dL)    Creatinine, Ser 1.61  0.50 - 1.35 (mg/dL)    Calcium 8.2 (*) 8.4 - 10.5 (mg/dL)    GFR calc non Af Amer 55 (*) >90 (mL/min)    GFR calc Af Amer 63 (*) >90 (mL/min)   CBC     Status: Abnormal   Collection Time   02/07/11  6:05 AM      Component Value Range Comment   WBC 3.5 (*) 4.0 - 10.5 (K/uL)    RBC 4.02 (*) 4.22 - 5.81 (MIL/uL)    Hemoglobin 11.6 (*) 13.0 - 17.0 (g/dL)    HCT 09.6 (*) 04.5 - 52.0 (%)    MCV 88.8  78.0 - 100.0 (fL)    MCH 28.9  26.0 - 34.0 (pg)    MCHC 32.5  30.0 - 36.0 (g/dL)    RDW 40.9  81.1 - 91.4 (%)    Platelets 100 (*) 150 - 400 (K/uL)   MRSA PCR SCREENING     Status: Abnormal   Collection Time   02/07/11 11:32 AM      Component Value Range Comment   MRSA by PCR POSITIVE (*) NEGATIVE      Micro Results: Recent Results (from the past 240 hour(s))  CULTURE, BLOOD (ROUTINE X 2)     Status: Normal (Preliminary result)   Collection Time   02/06/11  2:00 PM      Component Value Range Status Comment   Specimen Description BLOOD RIGHT UPPER ARM  1 ML IN AEROBIC ONLY   Final    Special Requests NONE   Final    Setup Time [id number]   Final    Culture     Final    Value:        BLOOD CULTURE RECEIVED NO GROWTH TO DATE CULTURE WILL BE HELD FOR 5 DAYS BEFORE ISSUING A FINAL NEGATIVE REPORT   Report Status PENDING   Incomplete   CULTURE, BLOOD (ROUTINE X 2)     Status: Normal (Preliminary result)   Collection Time   02/06/11  2:15 PM      Component Value Range Status Comment   Specimen Description BLOOD RIGHT HAND  1 ML IN AEROBIC ONLY   Final    Special Requests NONE   Final    Setup Time [id number]   Final    Culture     Final    Value:        BLOOD CULTURE RECEIVED NO GROWTH  TO DATE CULTURE WILL BE HELD FOR 5 DAYS BEFORE ISSUING A FINAL NEGATIVE REPORT   Report Status PENDING   Incomplete   URINE CULTURE     Status: Normal   Collection Time   02/06/11  2:46 PM      Component Value Range Status Comment   Specimen Description URINE,  CATHETERIZED   Final    Special Requests NONE   Final    Setup Time [id number]   Final    Colony Count NO GROWTH   Final    Culture NO GROWTH   Final    Report Status 02/07/2011 FINAL   Final   MRSA PCR SCREENING     Status: Abnormal   Collection Time   02/07/11 11:32 AM      Component Value Range Status Comment   MRSA by PCR POSITIVE (*) NEGATIVE  Final     Studies/Results: Dg Chest 2 View  02/06/2011  *RADIOLOGY REPORT*  Clinical Data: Altered mental status.  CHEST - 2 VIEW  Comparison: 25 12  Findings: Two views of the chest were obtained.  The patient has a coronary artery stents.  Prominent lung markings that suggest chronic changes.  There is no focal airspace disease.  The trachea is midline. Status post median sternotomy and cardiac valve surgery.  IMPRESSION: No acute chest findings.  Original Report Authenticated By: Richarda Overlie, M.D.   Medications: Scheduled Meds:   . albuterol  2.5 mg Nebulization Q6H  . aspirin  81 mg Oral QHS  . cefTRIAXone (ROCEPHIN)  IV  1 g Intravenous Q24H  . cholecalciferol  400 Units Oral Daily  . docusate sodium  100 mg Oral BID  . donepezil  23 mg Oral QHS  . enoxaparin  30 mg Subcutaneous Q24H  . folic acid  1 mg Oral Q24H  . ipratropium  0.5 mg Nebulization Q6H  . memantine  10 mg Oral BID  . polyethylene glycol  17 g Oral Daily  . QUEtiapine  100 mg Oral QHS   Continuous Infusions:    . dextrose 5 % and 0.9% NaCl 100 mL/hr at 02/08/11 1138  . DISCONTD: sodium chloride 75 mL/hr at 02/07/11 1128   PRN Meds:.albuterol, LORazepam, morphine, nitroGLYCERIN  Assessment/Plan: #1 Altered mental status-most likely secondary to deteriorating dementia or infection #2 Alzheimer's dementia #3 BPH (benign prostatic hyperplasia) #4 Fever-will continue emipric treatment with iv rocephin #5 Chronic kidney disease, stage III-will continue  rehydration Consult physical therapy   LOS: 2 days   Alline Pio 02/08/2011, 12:35 PM

## 2011-02-08 NOTE — Progress Notes (Signed)
Patient has been receiving Lovenox 30mg  sq q24h for VTE prophylaxis since admission because CrCl was < 61ml/min.  Scr has now improved to 1.11, CrCl = 45 ml/min H/H = 11.5/35.1, Pltc = 97 Weight = 58.3kg No bleeding reported in chart notes  A/P Since CrCl is now > 26ml/min and weight > 45kg, will increase lovenox to 40mg  sq q24h per protocol.  Rollene Fare, 02/08/2011, 8:13 AM Pager: [phone]

## 2011-02-09 DIAGNOSIS — E876 Hypokalemia: Secondary | ICD-10-CM | POA: Diagnosis not present

## 2011-02-09 DIAGNOSIS — L0390 Cellulitis, unspecified: Secondary | ICD-10-CM | POA: Diagnosis present

## 2011-02-09 DIAGNOSIS — R1310 Dysphagia, unspecified: Secondary | ICD-10-CM | POA: Diagnosis present

## 2011-02-09 LAB — DIFFERENTIAL
Basophils Relative: 0 % (ref 0–1)
Lymphs Abs: 0.9 10*3/uL (ref 0.7–4.0)
Monocytes Absolute: 0.4 10*3/uL (ref 0.1–1.0)
Monocytes Relative: 8 % (ref 3–12)
Neutro Abs: 2.9 10*3/uL (ref 1.7–7.7)
Neutrophils Relative %: 67 % (ref 43–77)

## 2011-02-09 LAB — COMPREHENSIVE METABOLIC PANEL
Alkaline Phosphatase: 86 U/L (ref 39–117)
BUN: 8 mg/dL (ref 6–23)
Chloride: 107 mEq/L (ref 96–112)
Creatinine, Ser: 0.91 mg/dL (ref 0.50–1.35)
GFR calc Af Amer: >90 mL/min (ref >90)
GFR calc non Af Amer: 78 mL/min — ABNORMAL LOW (ref >90)
Glucose, Bld: 90 mg/dL (ref 70–99)
Potassium: 3.4 mEq/L — ABNORMAL LOW (ref 3.5–5.1)
Total Bilirubin: 0.3 mg/dL (ref 0.3–1.2)

## 2011-02-09 LAB — CBC
HCT: 36.8 % — ABNORMAL LOW (ref 39.0–52.0)
Hemoglobin: 12.1 g/dL — ABNORMAL LOW (ref 13.0–17.0)
MCH: 29.1 pg (ref 26.0–34.0)
MCHC: 32.9 g/dL (ref 30.0–36.0)
RBC: 4.16 MIL/uL — ABNORMAL LOW (ref 4.22–5.81)

## 2011-02-09 MED ORDER — POTASSIUM CHLORIDE 10 MEQ/100ML IV SOLN
10.0000 meq | INTRAVENOUS | Status: AC
  Administered 2011-02-09 (×3): 10 meq via INTRAVENOUS
  Filled 2011-02-09 (×3): qty 100

## 2011-02-09 NOTE — Progress Notes (Signed)
PATIENT DETAILS Name: [name] Age: 77 y.o. Sex: male Date of Birth: 1930-02-13 Admit Date: 02/06/2011 Talmage Nap, MD, MD  CONSULTS:  1.  Speech Therapy  Interval History: Mr. Fuhrmann is a 77 year old male who was admitted on 02/06/11 with altered mental status in the setting of known dementia.  Upon initial evaluation in the ER, he was found to be febrile without leukocytosis and no obvious source of infection noted.  He was empirically put on rocephin for treatment of his fever and questionable infection. Blood cultures and urine cultures have been negative to date.  ROS: Mr. Imbert is pleasantly confused. His wife is at the bedside and states that he had been uncooperative with his nursing home staff and had developed weakness and changes in his mentation which prompted his transfer to the emergency department for evaluation. She denies that he's been coughing or short of breath. Patient is unable to answer any questions appropriately at this time.   Objective: Vital signs in last 24 hours: Temp:  [97.5 F (36.4 C)-98 F (36.7 C)] 97.9 F (36.6 C) (12/05 1447) Pulse Rate:  [75-85] 75  (12/05 1447) Resp:  [18-20] 20  (12/05 1447) BP: (134-163)/(75-81) 144/78 mmHg (12/05 1447) SpO2:  [92 %-100 %] 100 % (12/05 1447) Weight:  [56.9 kg (125 lb 7.1 oz)] 125 lb 7.1 oz (56.9 kg) (12/05 0426) Weight change:  Last BM Date: 02/09/11  Intake/Output from previous day:  Intake/Output Summary (Last 24 hours) at 02/09/11 1506 Last data filed at 02/09/11 1300  Gross per 24 hour  Intake   2620 ml  Output    200 ml  Net   2420 ml     Physical Exam:  Gen:  No acute distress. Cardiovascular:  Heart sounds regular. No murmurs, rubs, or gallops. Respiratory: Diminished in the bases but clear. Gastrointestinal: Abdomen soft, nontender, nondistended with normal active bowel sounds. Extremities: Left lower extremity has a denuded area skin on his anterior shin. Tegaderm was removed and a  large amount of pearly material was noted underneath the dressing. There is some surrounding erythema.   Lab Results: Basic Metabolic Panel:  Lab 02/09/11 1610 02/08/11 0533 02/07/11 0605 02/06/11 1100  NA 139 140 141 141  K 3.4* 3.6 -- --  CL 107 109 110 105  CO2 26 26 23 29   GLUCOSE 90 103* 95 115*  BUN 8 17 29* 26*  CREATININE 0.91 1.11 1.22 1.47*  CALCIUM 8.3* 8.1* 8.2* 9.0  MG -- -- -- --  PHOS -- -- -- --   GFR Estimated Creatinine Clearance: 53 ml/min (by C-G formula based on Cr of 0.91). Liver Function Tests:  Lab 02/09/11 0502 02/08/11 0533  AST 17 16  ALT 8 8  ALKPHOS 86 75  BILITOT 0.3 0.3  PROT 5.2* 4.8*  ALBUMIN 2.7* 2.5*    CBC:  Lab 02/09/11 0502 02/08/11 0533 02/07/11 0605 02/06/11 1100  WBC 4.4 4.4 3.5* 8.2  NEUTROABS 2.9 -- -- --  HGB 12.1* 11.5* 11.6* 13.1  HCT 36.8* 35.1* 35.7* 40.2  MCV 88.5 90.9 88.8 90.5  PLT 101* 97* 100* 128*   Cardiac Enzymes:  Lab 02/06/11 1100  CKTOTAL --  CKMB --  CKMBINDEX --  TROPONINI <0.30   Thyroid function studies  Basename 02/06/11 2230  TSH 2.567  T4TOTAL --  T3FREE --  THYROIDAB --   Anemia work up  [facility] 02/06/11 2230  VITAMINB12 187*  FOLATE >20.0  FERRITIN --  TIBC --  IRON --  RETICCTPCT --    Recent Results (from the past 240 hour(s))  CULTURE, BLOOD (ROUTINE X 2)     Status: Normal (Preliminary result)   Collection Time   02/06/11  2:00 PM      Component Value Range Status Comment   Specimen Description BLOOD RIGHT UPPER ARM  1 ML IN AEROBIC ONLY   Final    Special Requests NONE   Final    Setup Time [id number]   Final    Culture     Final    Value:        BLOOD CULTURE RECEIVED NO GROWTH TO DATE CULTURE WILL BE HELD FOR 5 DAYS BEFORE ISSUING A FINAL NEGATIVE REPORT   Report Status PENDING   Incomplete   CULTURE, BLOOD (ROUTINE X 2)     Status: Normal (Preliminary result)   Collection Time   02/06/11  2:15 PM      Component Value Range Status Comment   Specimen  Description BLOOD RIGHT HAND  1 ML IN AEROBIC ONLY   Final    Special Requests NONE   Final    Setup Time [id number]   Final    Culture     Final    Value:        BLOOD CULTURE RECEIVED NO GROWTH TO DATE CULTURE WILL BE HELD FOR 5 DAYS BEFORE ISSUING A FINAL NEGATIVE REPORT   Report Status PENDING   Incomplete   URINE CULTURE     Status: Normal   Collection Time   02/06/11  2:46 PM      Component Value Range Status Comment   Specimen Description URINE, CATHETERIZED   Final    Special Requests NONE   Final    Setup Time [id number]   Final    Colony Count NO GROWTH   Final    Culture NO GROWTH   Final    Report Status 02/07/2011 FINAL   Final   MRSA PCR SCREENING     Status: Abnormal   Collection Time   02/07/11 11:32 AM      Component Value Range Status Comment   MRSA by PCR POSITIVE (*) NEGATIVE  Final   CULTURE, BLOOD (ROUTINE X 2)     Status: Normal (Preliminary result)   Collection Time   02/07/11  3:25 PM      Component Value Range Status Comment   Specimen Description BLOOD RIGHT ARM   Final    Special Requests BOTTLES DRAWN AEROBIC AND ANAEROBIC 5CC   Final    Setup Time [id number]   Final    Culture     Final    Value:        BLOOD CULTURE RECEIVED NO GROWTH TO DATE CULTURE WILL BE HELD FOR 5 DAYS BEFORE ISSUING A FINAL NEGATIVE REPORT   Report Status PENDING   Incomplete   CULTURE, BLOOD (ROUTINE X 2)     Status: Normal (Preliminary result)   Collection Time   02/07/11  3:35 PM      Component Value Range Status Comment   Specimen Description BLOOD RIGHT ARM   Final    Special Requests BOTTLES DRAWN AEROBIC AND ANAEROBIC 3CC   Final    Setup Time [id number]   Final    Culture     Final    Value:        BLOOD CULTURE RECEIVED NO GROWTH TO DATE CULTURE WILL BE HELD FOR 5 DAYS BEFORE ISSUING A FINAL NEGATIVE REPORT  Report Status PENDING   Incomplete   URINE CULTURE     Status: Normal   Collection Time   02/07/11  6:30 PM      Component Value Range Status Comment     Specimen Description URINE, RANDOM   Final    Special Requests NONE   Final    Setup Time [id number]   Final    Colony Count NO GROWTH   Final    Culture NO GROWTH   Final    Report Status 02/08/2011 FINAL   Final     Studies/Results: No results found.  Medications: Scheduled Meds:   . albuterol  2.5 mg Nebulization TID  . aspirin  81 mg Oral QHS  . cefTRIAXone (ROCEPHIN)  IV  1 g Intravenous Q24H  . Chlorhexidine Gluconate Cloth  6 each Topical Q0600  . cholecalciferol  400 Units Oral Daily  . docusate sodium  100 mg Oral BID  . donepezil  23 mg Oral QHS  . enoxaparin (LOVENOX) injection  40 mg Subcutaneous QHS  . folic acid  1 mg Oral Q24H  . ipratropium  0.5 mg Nebulization TID  . memantine  10 mg Oral BID  . mupirocin ointment  1 application Nasal BID  . polyethylene glycol  17 g Oral Daily  . QUEtiapine  100 mg Oral QHS   Continuous Infusions:   . dextrose 5 % and 0.9% NaCl 100 mL/hr at 02/09/11 1300   PRN Meds:.albuterol, LORazepam, morphine, nitroGLYCERIN Antibiotics: Anti-infectives     Start     Dose/Rate Route Frequency Ordered Stop   02/07/11 1600   cefTRIAXone (ROCEPHIN) 1 g in dextrose 5 % 50 mL IVPB        1 g 100 mL/hr over 30 Minutes Intravenous Every 24 hours 02/07/11 1440             Assessment/Plan:  Principal Problem:  *Altered mental status/ Alzheimer's dementia Assessment: Likely a combination of dementia in the setting of acute illness.  Plan: At this point, we are treating his underlying cellulitis and we will obtain PT and OT evaluations as he will likely need a higher level of skilled nursing care at discharge. Active Problems:  Fever/cellulitis Assessment: The patient's fever is most likely due to a small area of cellulitis. His urine cultures and blood cultures are negative and he has no clinical signs or symptoms suggestive of pneumonia. Plan: Continue Rocephin. Obtain wound cultures. Routine wound care to the left lower leg.   ARF/Chronic kidney disease, stage III (moderate) Assessment: Renal function improving. Baseline creatinine recorded at 1.31 on 07/02/2009. Plan: Continue to hydrate and monitor.  Hypokalemia Assessment: Mild. Plan: Replete.  Dysphagia Assessment: Consistent with known history of dementia. Plan: Per speech therapy recommendations.     LOS: 3 days   Hillery Aldo, MD Pager [phone]  02/09/2011, 3:06 PM

## 2011-02-09 NOTE — Progress Notes (Signed)
Physical Therapy Evaluation Patient Details Name: [name] MRN: [id number] DOB: 11-28-1929 Today's Date: 02/09/2011 [id number] Ev2  Problem List:  Patient Active Problem List  Diagnoses  . Alzheimer's dementia  . BPH (benign prostatic hyperplasia)  . Fever  . Altered mental status  . Chronic kidney disease, stage III (moderate)  . Hypokalemia  . Dysphagia  . Cellulitis    Past Medical History:  Past Medical History  Diagnosis Date  . GERD (gastroesophageal reflux disease)   . Alzheimer's dementia   . Dementia   . Coronary artery disease   . Hyperlipidemia   . BPH (benign prostatic hyperplasia)   . Thrombocytopenia   . History of aortic stenosis     Status post AVR with bioprosthetic valve   Past Surgical History:  Past Surgical History  Procedure Date  . Coronary artery bypass graft January 20, 1999    PT Assessment/Plan/Recommendation PT Assessment Clinical Impression Statement: Patient admitted with AMS and UTI with fever.  Presents to PT with decresed mobility secondary to generalized weakness and agitation.  Will benefit from skilled PT in the acute setting to achieve decreased burden of care for improved mobility at next venue.  Will need SNF placement.  Patient's wife prefers [facility]. PT Recommendation/Assessment: Patient will need skilled PT in the acute care venue PT Problem List: Decreased strength;Decreased activity tolerance;Decreased mobility;Decreased safety awareness PT Therapy Diagnosis : Abnormality of gait;Generalized weakness PT Plan PT Treatment/Interventions: DME instruction;Gait training;Functional mobility training;Therapeutic activities;Therapeutic exercise;Patient/family education PT Recommendation Follow Up Recommendations: Skilled nursing facility Equipment Recommended: Defer to next venue PT Goals  Acute Rehab PT Goals PT Goal Formulation: With patient/family Time For Goal Achievement: 2 weeks Pt will go Supine/Side to  Sit: with mod assist PT Goal: Supine/Side to Sit - Progress: Progressing toward goal Pt will go Sit to Supine/Side: with mod assist PT Goal: Sit to Supine/Side - Progress: Progressing toward goal Pt will go Sit to Stand: with mod assist PT Goal: Sit to Stand - Progress: Progressing toward goal Pt will go Stand to Sit: with mod assist PT Goal: Stand to Sit - Progress: Progressing toward goal Pt will Transfer Bed to Chair/Chair to Bed: with +2 total assist (pt=50%) PT Transfer Goal: Bed to Chair/Chair to Bed - Progress: Other (comment) (limited progress today with agitation) Pt will Ambulate: 16 - 50 feet;with +2 total assist;with least restrictive assistive device (pt=40%) PT Goal: Ambulate - Progress: Other (comment) (limited today due to agitation (worse in evening))  PT Evaluation Precautions/Restrictions  Precautions Precautions: Fall Prior Functioning  Home Living Type of Home: Independent living facility [facility]) Prior Function Level of Independence: Requires assistive device for independence;Needs assistance with tranfers;Needs assistance with gait;Needs assistance with ADLs Cognition Cognition Arousal/Alertness: Awake/alert Overall Cognitive Status: History of cognitive impairments History of Cognitive Impairment: Decline in baseline functioning Attention: Impaired Current Attention Level: Focused Orientation Level: Oriented to person;Disoriented to place;Disoriented to time;Disoriented to situation Following Commands: Follows one step commands inconsistently Safety/Judgement: Decreased awareness of safety precautions;Decreased safety judgement for tasks assessed Decreased Safety/Judgement: Other (comment) Safety/Judgement - Other Comments: Fearful of falling...to the point of resisting and pulling up feet Cognition - Other Comments: moments of clarity when wife mentions familiar names, but mostly agitated and fighting at  restraints Sensation/Coordination   Extremity Assessment RLE Assessment RLE Assessment: Exceptions to [facility] RLE Strength RLE Overall Strength Comments: demonstrates antigravity strength, unable to formally test RLE Tone RLE Tone Comments: incresed rigidity at times with fighting when fearful, can relax,  but likely still with some increased muscle tone LLE Assessment LLE Assessment: Exceptions to [facility] LLE Strength LLE Overall Strength Comments: demo antigravity strength, unable to formally assess LLE Tone LLE Tone Comments: incresed rigidity at times with fighting when fearful, can relax, but likely still with some increased muscle tone Mobility (including Balance) Bed Mobility Bed Mobility: Yes Supine to Sit: 1: +2 Total assist Supine to Sit Details (indicate cue type and reason): pt approximately 30% (moments when he self supports balance, then moments he falls back) Sitting - Scoot to Edge of Bed: 1: +2 Total assist Sitting - Scoot to Edge of Bed Details (indicate cue type and reason): pt approx 30% (apraxic therefore much effort into leaning back and pulling on our hands to scoot out to EOB) Sit to Supine - Left: 1: +2 Total assist Sit to Supine - Left Details (indicate cue type and reason): Pt=0% due to fear of falling, needing to scoot up in bed Scooting to [facility]: 1: +2 Total assist Scooting to [facility] Details (indicate cue type and reason): pt= 0 Transfers Transfers: Yes Sit to Stand: 1: +2 Total assist;From bed Sit to Stand Details (indicate cue type and reason): pt initially accepted weight onto feet, then fearful and leaning back pushing feet forward (overall pt=20% Stand to Sit: 1: +2 Total assist;To bed Stand to Sit Details: pt=20%  assist to control descent  Posture/Postural Control Posture/Postural Control: Postural limitations Postural Limitations: significant fear of falling with decreased  postural awareness and needing assist for orienting to feet on floor  etc. Balance Balance Assessed: Yes Static Sitting Balance Static Sitting - Balance Support: Feet unsupported;No upper extremity supported;Left upper extremity supported Static Sitting - Level of Assistance: 2: Max assist;4: Min assist Static Sitting - Comment/# of Minutes: fluctuates based on level of fear and demo increased rigidity with fear of falling Exercise    End of Session PT - End of Session Equipment Utilized During Treatment: Gait belt Activity Tolerance: Treatment limited secondary to agitation Patient left: in bed;with restraints reapplied;with family/visitor present General Behavior During Session: Agitated Cognition: Impaired, at baseline  [facility] 02/09/2011, 5:58 PM

## 2011-02-10 LAB — CBC
HCT: 35.2 % — ABNORMAL LOW (ref 39.0–52.0)
Hemoglobin: 11.7 g/dL — ABNORMAL LOW (ref 13.0–17.0)
MCH: 29.2 pg (ref 26.0–34.0)
MCHC: 33.2 g/dL (ref 30.0–36.0)
MCV: 87.8 fL (ref 78.0–100.0)

## 2011-02-10 LAB — BASIC METABOLIC PANEL
BUN: 5 mg/dL — ABNORMAL LOW (ref 6–23)
CO2: 25 mEq/L (ref 19–32)
Chloride: 110 mEq/L (ref 96–112)
Creatinine, Ser: 0.96 mg/dL (ref 0.50–1.35)
GFR calc Af Amer: 89 mL/min — ABNORMAL LOW (ref >90)
Glucose, Bld: 104 mg/dL — ABNORMAL HIGH (ref 70–99)
Potassium: 3.6 mEq/L (ref 3.5–5.1)

## 2011-02-10 MED ORDER — DOXYCYCLINE HYCLATE 50 MG PO CAPS: 100.0000 mg | ORAL_CAPSULE | Freq: Two times a day (BID) | ORAL | Status: AC

## 2011-02-10 MED ORDER — MUPIROCIN 2 % EX OINT: 1.0000 "application " | TOPICAL_OINTMENT | Freq: Two times a day (BID) | CUTANEOUS | Status: AC

## 2011-02-10 MED ORDER — ALBUTEROL SULFATE (5 MG/ML) 0.5% IN NEBU: 2.5000 mg | INHALATION_SOLUTION | Freq: Four times a day (QID) | RESPIRATORY_TRACT | Status: AC | PRN

## 2011-02-10 NOTE — Progress Notes (Signed)
Patient set to discharge to [facility] today. Facility aware & wife, Vena Austria aware. [facility] called for transportation.   Unice Bailey, LCSWA [phone]

## 2011-02-10 NOTE — Progress Notes (Signed)
While taking pt. Vitals his O2 sat was 78% on 2 Liters via [place]. I then put him on the venti mask at 50%, his HR then dipped down in the 20s the code was then called and he was then put on a 100% NRB.  Code then cancelled after code team came, pt. Became alert when new IV was attempted.  At this time pt. Is alert and combative.  Current HR is 68, BP136/97  and O2 sat is 100% on 2L via [place], MD was called new order for cont. Pulse ox received.   Cammy Brochure RN

## 2011-02-10 NOTE — Progress Notes (Signed)
CSW spoke with patient's wife, Vena Austria (home#: [phone] cell#: [phone]) re: discharge plans. Patient was admitted from [facility] but PT is recommending that patient go to SNF. CSW completed FL2 and faxed out. Patient's step-son, Levora Dredge is the administrator @ [facility] and wife is requesting that patient discharge there. CSW confirmed with Judeth Cornfield @ [facility] that they are able to accept patient and they are putting him in a locked dementia unit. Anticipating discharge Thursday/Friday.   Unice Bailey, [place] [phone]  See shadow chart for yellow clinical social work assessment and placement note.

## 2011-02-10 NOTE — Progress Notes (Signed)
Rapid Response Event Note  Overview:  Code Blue called in room 1444. Once in room per RN Shila code canceled pt awake and combative with strong bounding pulse. Per Shila RN patient found with low heart rate in 20s, and nonresponsive, code blue called at 0630.       Initial Focused Assessment: Upon arrival at 0630 pt awake moving all extreme ties, combative. Placed on monitor in sr 65 bpm, bp 136/97, pulse oximeter 100% on  VM. Pt does not appear to be having any trouble breathing. Per RN Cammy Brochure pt is at current baseline status.  Interventions: RN Shila placed call to [facility] to update on pt status encouraged to place pt on contnuous pulse oximeter, already on tele.  Currently resting in bed at baseline mental status, VSS.  Event Summary:   at      at          [facility], James Ivanoff Harlynn Kimbell

## 2011-02-10 NOTE — Discharge Summary (Signed)
Physician Discharge Summary  Patient ID: [name] MRN: [id number] DOB/AGE: [birth year] 77 y.o.  Admit date: 02/06/2011 Discharge date: 02/10/2011  Primary Care Physician:  Jose Grippe, MD  Discharge Diagnoses:    Present on Admission:  .Fever .Altered mental status .Alzheimer's dementia .Chronic kidney disease, stage III (moderate) .Dysphagia .Cellulitis  Discharge Medications:  Current Discharge Medication List    START taking these medications   Details  albuterol (PROVENTIL) (5 MG/ML) 0.5% nebulizer solution Take 0.5 mLs (2.5 mg total) by nebulization every 6 (six) hours as needed for wheezing or shortness of breath. Qty: 20 mL    doxycycline (VIBRAMYCIN) 50 MG capsule Take 2 capsules (100 mg total) by mouth 2 (two) times daily.    mupirocin ointment (BACTROBAN) 2 % Apply 1 application topically 2 (two) times daily. Qty: 22 g, Refills: 0      CONTINUE these medications which have NOT CHANGED   Details  acetaminophen (MAPAP) 325 MG tablet Take 650 mg by mouth every 4 (four) hours as needed. For pain. Not to exceed 4 grams in 24 hours.     aspirin 81 MG chewable tablet Chew 81 mg by mouth at bedtime.      docusate sodium (COLACE) 100 MG capsule Take 100 mg by mouth 2 (two) times daily.      donepezil (ARICEPT) 23 MG TABS tablet Take 23 mg by mouth at bedtime.      folic acid (FOLVITE) 1 MG tablet Take 1 mg by mouth every morning.      LORazepam (ATIVAN) 0.5 MG tablet Take 0.5 mg by mouth every 8 (eight) hours as needed. For anxiety/agitation.     memantine (NAMENDA) 10 MG tablet Take 10 mg by mouth 2 (two) times daily.      nitroGLYCERIN (NITROSTAT) 0.4 MG SL tablet Place 0.4 mg under the tongue every 5 (five) minutes as needed. For chest pain.     polyethylene glycol (MIRALAX / GLYCOLAX) packet Take 17 g by mouth daily as needed. Mix in liquid. For constipation.     PRESCRIPTION MEDICATION Take 30 mLs by mouth 2 (two) times daily. Pro-stat 101 Sugar Free 15  Gram 101/30 Liquid.     QUEtiapine (SEROQUEL) 100 MG tablet Take 100 mg by mouth at bedtime.      vitamin D, CHOLECALCIFEROL, 400 UNITS tablet Take 400 Units by mouth every morning.           Disposition and Follow-up: The patient is being discharged to a skilled nursing facility. He should followup with his primary care physician in 2 weeks.  Consults:   1. Speech Therapy   Significant Diagnostic Studies:  Dg Chest 2 View  02/06/2011  *RADIOLOGY REPORT*  Clinical Data: Altered mental status.  CHEST - 2 VIEW  Comparison: 25 12  Findings: Two views of the chest were obtained.  The patient has a coronary artery stents.  Prominent lung markings that suggest chronic changes.  There is no focal airspace disease.  The trachea is midline. Status post median sternotomy and cardiac valve surgery.  IMPRESSION: No acute chest findings.  Original Report Authenticated By: Richarda Overlie, M.D.    Discharge Laboratory Values: Basic Metabolic Panel:  Lab 02/10/11 4696 02/09/11 0502 02/08/11 0533 02/07/11 0605 02/06/11 1100  NA 141 139 140 141 141  K 3.6 3.4* -- -- --  CL 110 107 109 110 105  CO2 25 26 26 23 29   GLUCOSE 104* 90 103* 95 115*  BUN 5* 8 17 29* 26*  CREATININE 0.96 0.91 1.11 1.22 1.47*  CALCIUM 8.4 8.3* 8.1* 8.2* 9.0  MG -- -- -- -- --  PHOS -- -- -- -- --   GFR Estimated Creatinine Clearance: 50.2 ml/min (by C-G formula based on Cr of 0.96). Liver Function Tests:  Lab 02/09/11 0502 02/08/11 0533  AST 17 16  ALT 8 8  ALKPHOS 86 75  BILITOT 0.3 0.3  PROT 5.2* 4.8*  ALBUMIN 2.7* 2.5*     CBC:  Lab 02/10/11 0453 02/09/11 0502 02/08/11 0533 02/07/11 0605 02/06/11 1100  WBC 4.6 4.4 4.4 3.5* 8.2  NEUTROABS -- 2.9 -- -- --  HGB 11.7* 12.1* 11.5* 11.6* 13.1  HCT 35.2* 36.8* 35.1* 35.7* 40.2  MCV 87.8 88.5 90.9 88.8 90.5  PLT 98* 101* 97* 100* 128*   Cardiac Enzymes:  Lab 02/06/11 1100  CKTOTAL --  CKMB --  CKMBINDEX --  TROPONINI <0.30    Brief H and P: For  complete details please refer to admission H and P, but in brief, [name] is a 77 year old male who was admitted on 02/06/11 with altered mental status in the setting of known dementia. Upon initial evaluation in the ER, he was found to be febrile without leukocytosis and no obvious source of infection noted.   Physical Exam at Discharge: BP 127/70  Pulse 75  Temp(Src) 97.5 F (36.4 C) (Axillary)  Resp 20  Ht 5\' 8"  (1.727 m)  Wt 56.9 kg (125 lb 7.1 oz)  BMI 19.07 kg/m2  SpO2 100% Gen: No acute distress.  Cardiovascular: Heart sounds regular. No murmurs, rubs, or gallops.  Respiratory: Diminished in the bases but clear.  Gastrointestinal: Abdomen soft, nontender, nondistended with normal active bowel sounds.  Extremities: Left lower extremity has a denuded area skin on his anterior shin. Tegaderm was removed and a large amount of pearly material was noted underneath the dressing. There is some surrounding erythema.  Hospital Course:  Principal Problem:  *Altered mental status The patient's mental status changes were felt to be due to a combination of advanced dementia in the setting of acute illness. His underlying cellulitis was treated with antibiotics and PT and OT evaluations were done and the recommendations for a higher level of skilled nursing care were noted. Being discharged to a skilled level of nursing care. Active Problems:  Alzheimer's dementia The patient can continue on his usual doses of Aricept and Namenda.  Fever Upon initial evaluation in the ER, the patient was found to be febrile without leukocytosis and no obvious source of infection noted. He was empirically put on rocephin for treatment of his fever and questionable infection. Blood cultures and urine cultures have been negative to date. He was discovered to have a left lower extremity wound which was quite purulent and consistent with focal cellulitis. He is being discharged on doxycycline days of therapy to address  this.  Chronic kidney disease, stage III (moderate) The patient's baseline creatinine was 1.31 back on 07/02/2009. His current creatinine is much better.  Hypokalemia The patient's electrolytes were monitored closely and his potassium was replaced.  Dysphagia She was seen and evaluated by the speech therapist. He should be maintained on aspiration precautions and be fed only when fully alert. No recommendations for thickened liquids at present. Recommend outpatient speech therapy followup.  Cellulitis Patient has been on Rocephin empirically. We will switch him over to by mouth doxycycline for an additional 7 days for a total treatment course of 10 days of therapy.``  Recommendations for hospital follow-up: 1. Speech therapy.  Time spent on Discharge: 50 minutes.  Signed: Dr. Trula Ore Rama Pager [phone] 02/10/2011, 1:37 PM

## 2011-02-12 LAB — CULTURE, BLOOD (ROUTINE X 2)
Culture  Setup Time: 201212021805
Culture: NO GROWTH

## 2011-02-12 LAB — WOUND CULTURE: Gram Stain: NONE SEEN

## 2011-02-13 LAB — CULTURE, BLOOD (ROUTINE X 2)
Culture  Setup Time: 201212032334
Culture: NO GROWTH

## 2011-04-11 IMAGING — CT CT ABD-PELV W/ CM
2 of 5 series · 17 of 46 positions shown, 19 images · IV contrast (READICAT/WATER & [ID] OMNI 300)
Comparison: [HOSPITAL] chest x-ray 07/30/2008 and renal
ultrasound 08/04/2008 and lateral lumbar radiograph 01/11/2006.

CLINICAL DATA: 20 pounds weight loss over 6 months.

CT ABDOMEN AND PELVIS WITH CONTRAST
TECHNIQUE: Multidetector CT imaging of the abdomen and pelvis was
performed following the standard protocol during bolus
administration of intravenous contrast.
Contrast: Intravenous 100 ml [id number].

[Series 2: abdomen w/ · axial · 0.76mm/px · z∈[-336,+19]mm · 14 of 81 slices shown, 16 images]
[im 5/81  soft-tissue]
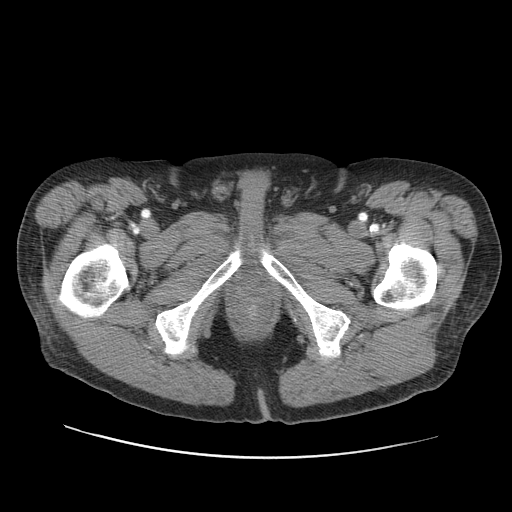
[im 5/81  bone]
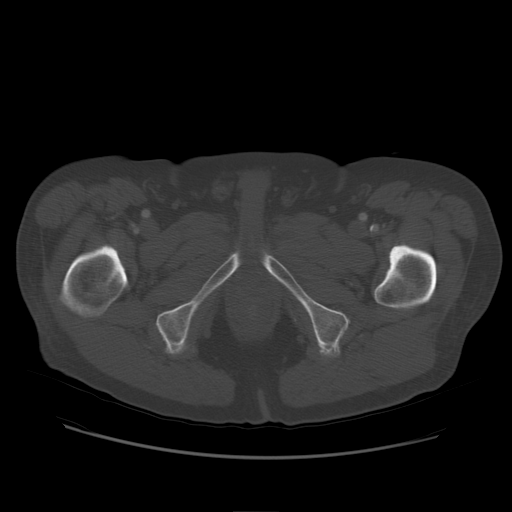
[im 9/81  soft-tissue]
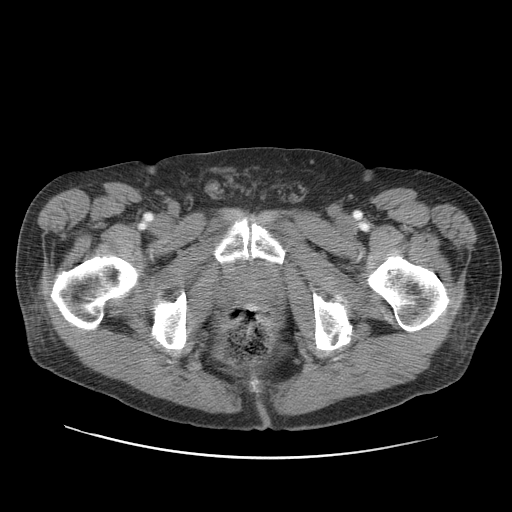
[im 17/81  soft-tissue]
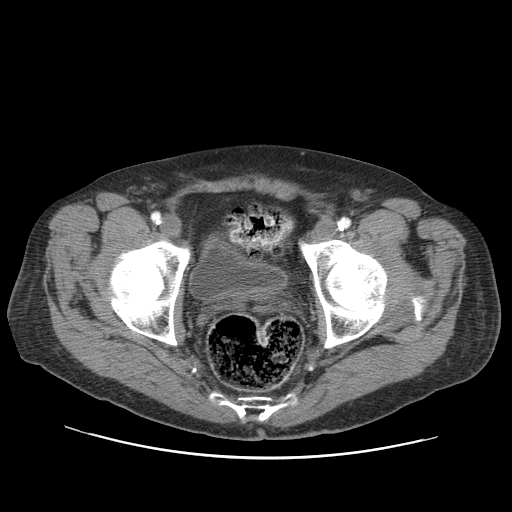
[im 22/81  soft-tissue]
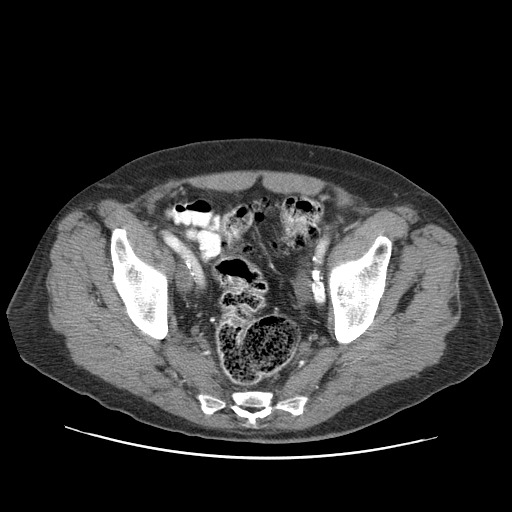
[im 26/81  soft-tissue]
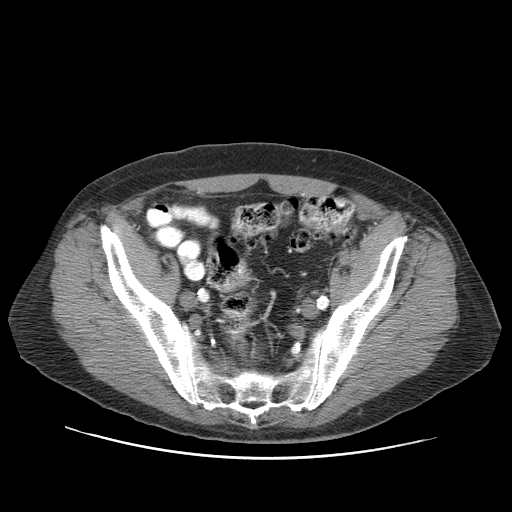
[im 34/81  soft-tissue]
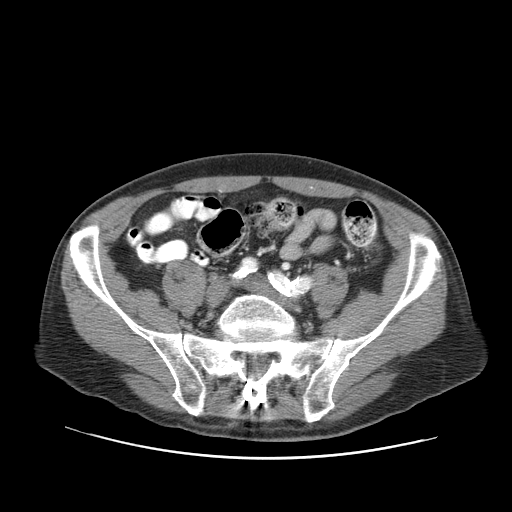
[im 38/81  soft-tissue]
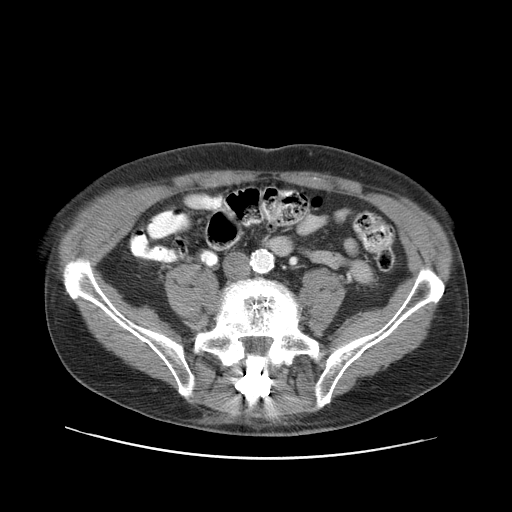
[im 43/81  soft-tissue]
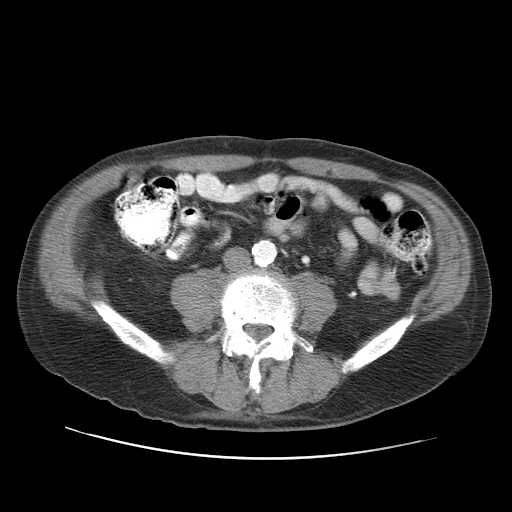
[im 47/81  soft-tissue]
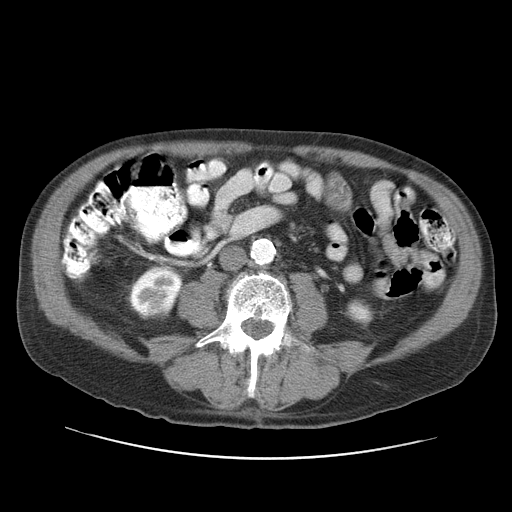
[im 47/81  bone]
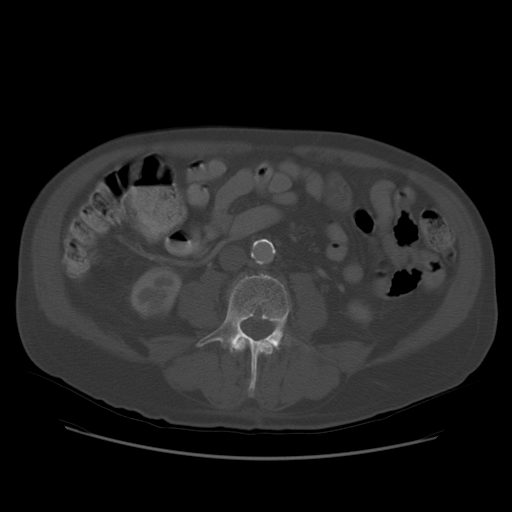
[im 55/81  soft-tissue]
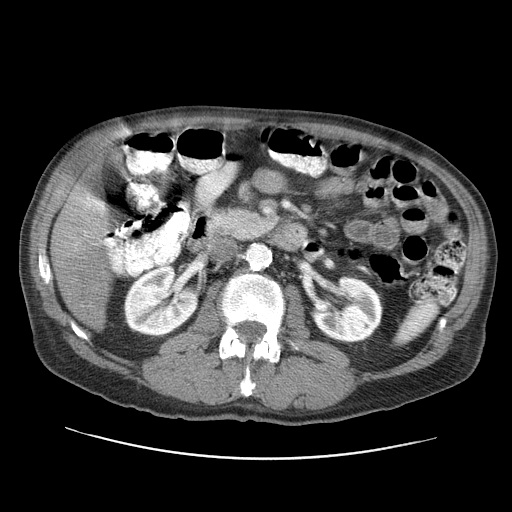
[im 59/81  soft-tissue]
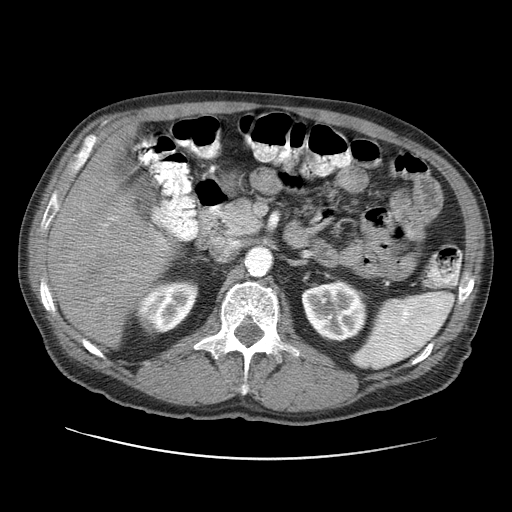
[im 64/81  soft-tissue]
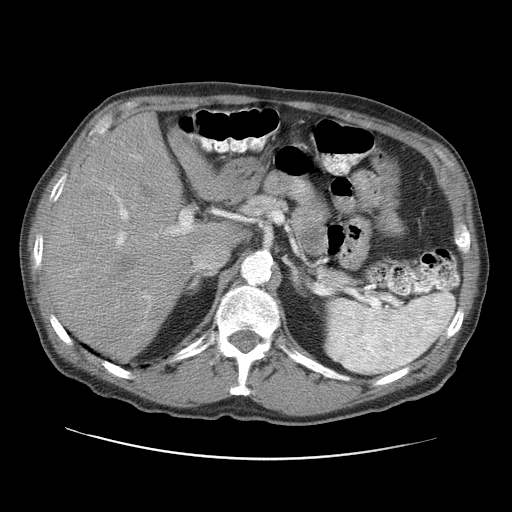
[im 72/81  soft-tissue]
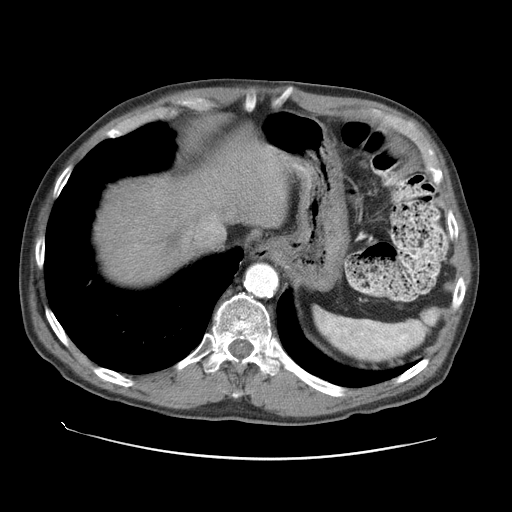
[im 76/81  soft-tissue]
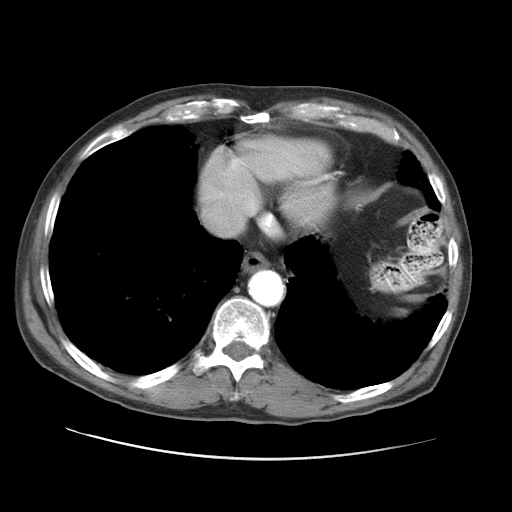

[Series 401: cor · coronal · 0.82mm/px · 3 of 121 slices shown]
[im 41/121  soft-tissue]
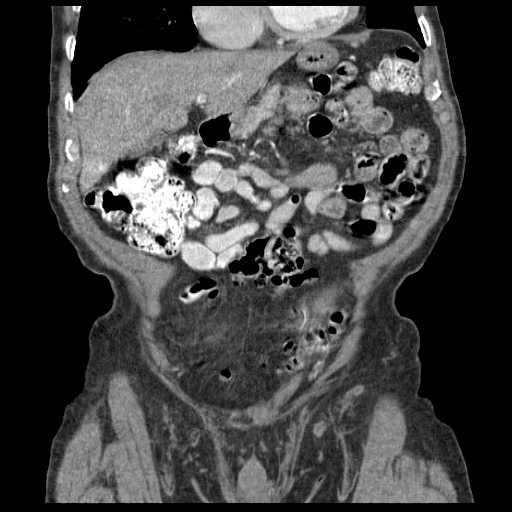
[im 54/121  soft-tissue]
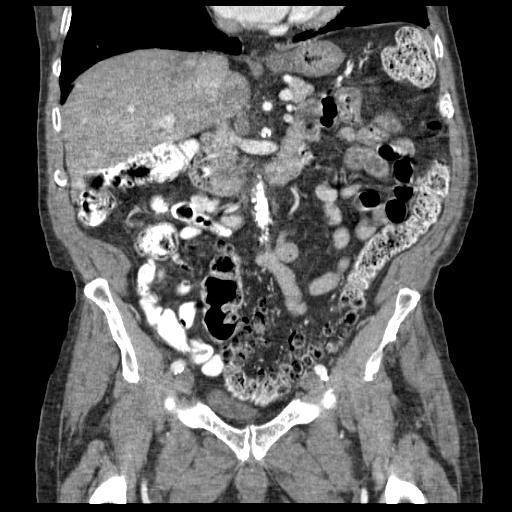
[im 67/121  soft-tissue]
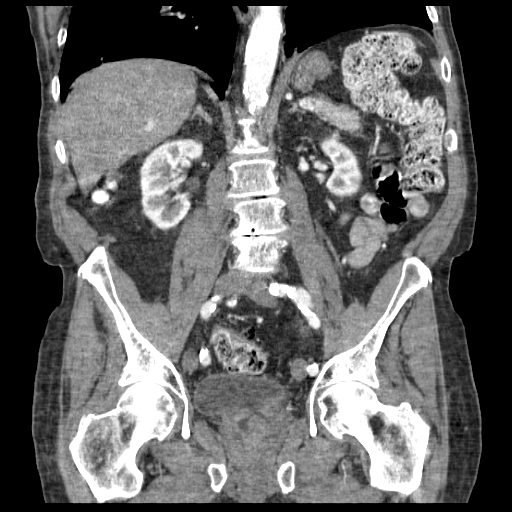

[17 of 46 positions shown; findings below may reference images not displayed]

FINDINGS: Extensive atheromatous vascular calcification with
probable right renal arterial stent is seen without aneurysm.  The
visceral arterial branches are patent.  17 mm simple cyst lower
pole right kidney seen.  Slight COPD changes lung bases noted.
Partially imaged post CABG changes and heart size upper limits of
normal visualized.  Slight descending and sigmoid diverticulosis
without inflammatory change visualized.  Prostate is slightly
enlarged measuring 3.6 cm AP X 5.7 cm wide with probable central
TURP defect.  Remaining abdominal organs appear normal without
inflammation, free fluid or adenopathy.  Posterior L4-5 interbody
fusion changes seen with laminectomy defect.  Progressive advanced
L3-4 degenerative disc disease is seen since 01/11/2006.
IMPRESSION: 1.  Extensive atheromatous vascular calcification without aneurysm
(probable right renal arterial stent) and partially imaged CABG.
2.  Lung bases stable COPD.
3.  Slight descending and sigmoid diverticulosis without
inflammation.
4.  Slight prostatic enlargement with probable central TURP defect.
5.  Progressive interval advanced L3-4 degenerative disc disease
with stable postsurgical changes L4-5.
6.  Otherwise, negative.

## 2011-07-17 IMAGING — CR DG CHEST 1V PORT
1 series · 1 of 1 positions shown · non-contrast
Comparison: 07/30/2008

CLINICAL DATA: Chest pain.

PORTABLE CHEST - 1 VIEW

[view not recorded]
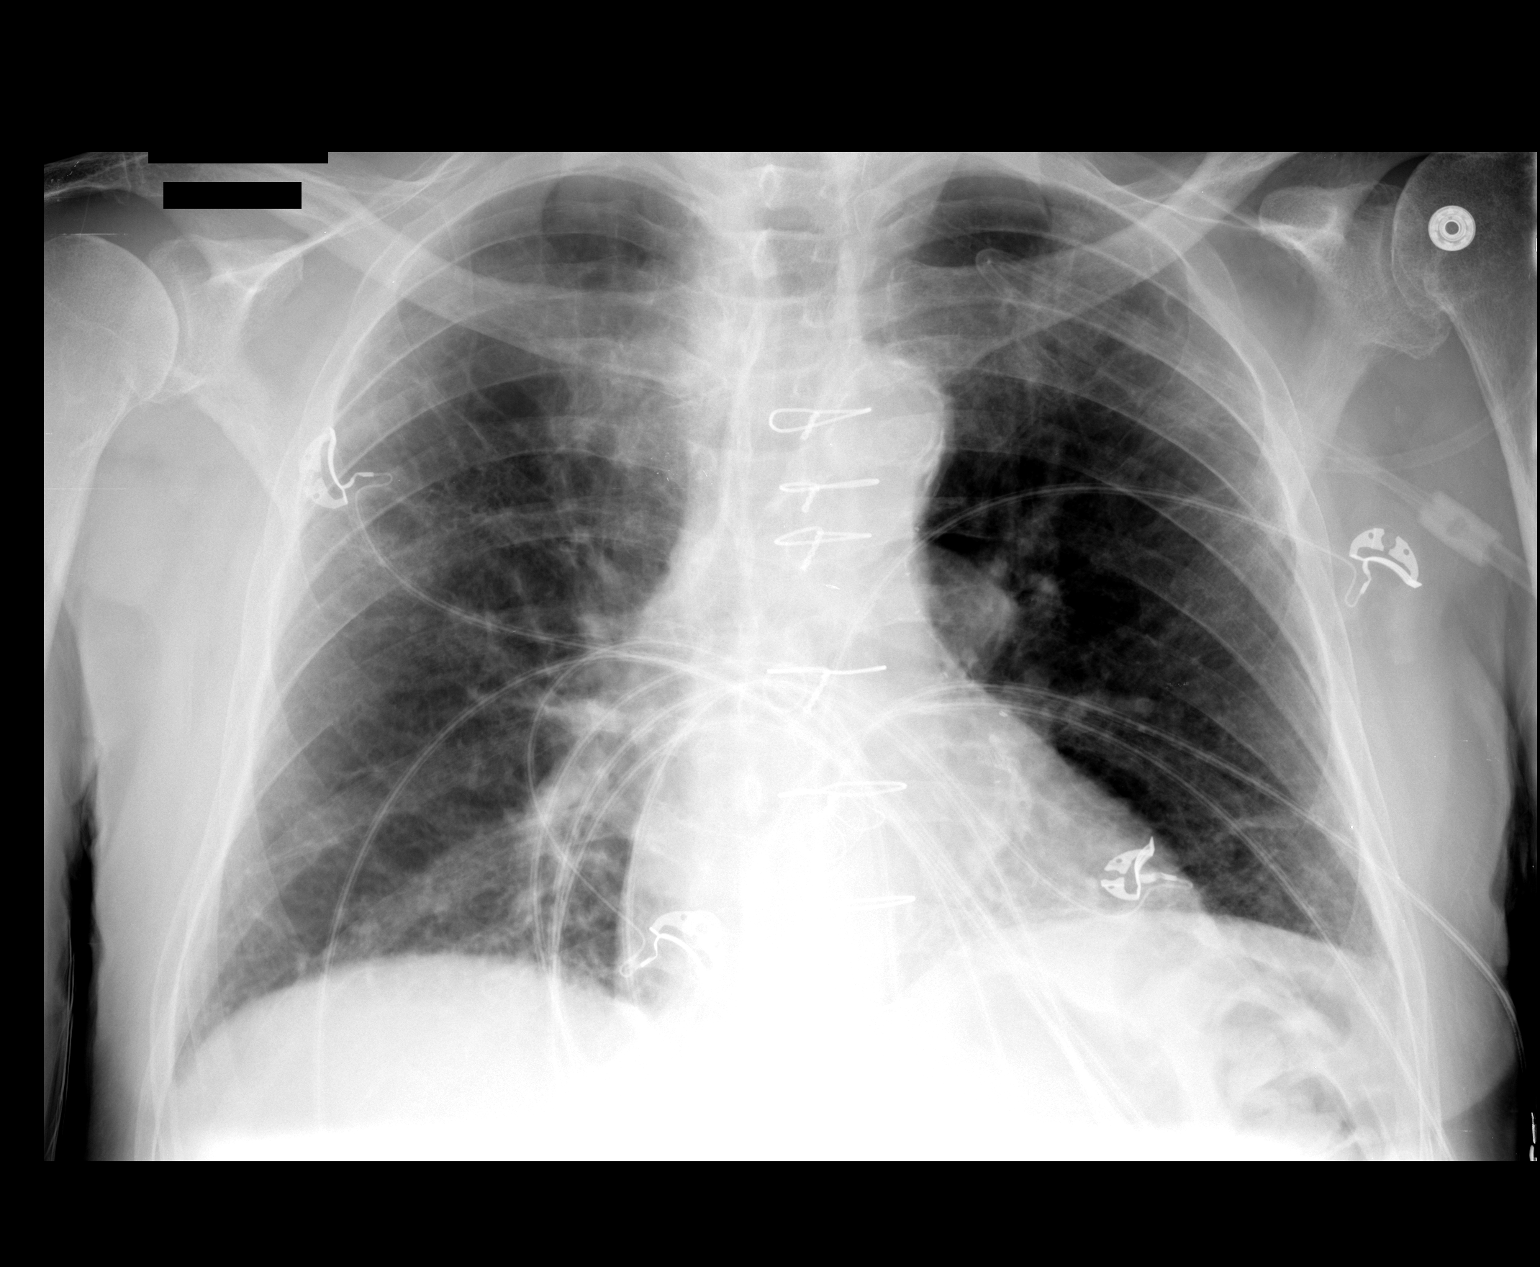

[1 of 1 positions shown; findings below may reference images not displayed]

FINDINGS: Prior CABG.  Heart is upper limits normal in size.  There
is bibasilar scarring or atelectasis.  No effusions or edema.  No
acute bony abnormality.  Degenerative changes in the shoulders.
IMPRESSION: Prior CABG.  Bibasilar scarring or atelectasis.

## 2013-03-07 DEATH — deceased

## 2013-03-25 ENCOUNTER — Telehealth: Payer: Self-pay

## 2013-03-25 NOTE — Telephone Encounter (Signed)
Patient past away per Obituary in [employer] [employer] °
